# Patient Record
Sex: Female | Born: 1965 | Race: White | Hispanic: No | Smoking: Never smoker
Health system: Southern US, Community
[De-identification: ages and names within clinical notes are randomized; demographics above are authoritative.]

## PROBLEM LIST (undated history)

## (undated) DIAGNOSIS — E119 Type 2 diabetes mellitus without complications: Secondary | ICD-10-CM

## (undated) DIAGNOSIS — I10 Essential (primary) hypertension: Secondary | ICD-10-CM

## (undated) DIAGNOSIS — F319 Bipolar disorder, unspecified: Secondary | ICD-10-CM

---

## 1998-05-04 ENCOUNTER — Emergency Department (HOSPITAL_COMMUNITY): Admission: EM | Admit: 1998-05-04 | Discharge: 1998-05-04 | Payer: Self-pay | Admitting: Emergency Medicine

## 1998-05-13 ENCOUNTER — Ambulatory Visit (HOSPITAL_COMMUNITY): Admission: RE | Admit: 1998-05-13 | Discharge: 1998-05-14 | Payer: Self-pay | Admitting: Orthopaedic Surgery

## 1998-10-27 ENCOUNTER — Encounter: Admission: RE | Admit: 1998-10-27 | Discharge: 1999-01-25 | Payer: Self-pay | Admitting: Orthopaedic Surgery

## 1999-07-09 ENCOUNTER — Other Ambulatory Visit: Admission: RE | Admit: 1999-07-09 | Discharge: 1999-07-09 | Payer: Self-pay | Admitting: Obstetrics & Gynecology

## 2001-07-16 ENCOUNTER — Other Ambulatory Visit: Admission: RE | Admit: 2001-07-16 | Discharge: 2001-07-16 | Payer: Self-pay | Admitting: Obstetrics & Gynecology

## 2002-07-17 ENCOUNTER — Other Ambulatory Visit: Admission: RE | Admit: 2002-07-17 | Discharge: 2002-07-17 | Payer: Self-pay | Admitting: Obstetrics & Gynecology

## 2003-07-22 ENCOUNTER — Other Ambulatory Visit: Admission: RE | Admit: 2003-07-22 | Discharge: 2003-07-22 | Payer: Self-pay | Admitting: Obstetrics & Gynecology

## 2004-07-27 ENCOUNTER — Other Ambulatory Visit: Admission: RE | Admit: 2004-07-27 | Discharge: 2004-07-27 | Payer: Self-pay | Admitting: Obstetrics & Gynecology

## 2004-11-30 ENCOUNTER — Ambulatory Visit (HOSPITAL_BASED_OUTPATIENT_CLINIC_OR_DEPARTMENT_OTHER): Admission: RE | Admit: 2004-11-30 | Discharge: 2004-11-30 | Payer: Self-pay | Admitting: Otolaryngology

## 2004-11-30 ENCOUNTER — Encounter (INDEPENDENT_AMBULATORY_CARE_PROVIDER_SITE_OTHER): Payer: Self-pay | Admitting: *Deleted

## 2004-11-30 ENCOUNTER — Ambulatory Visit (HOSPITAL_COMMUNITY): Admission: RE | Admit: 2004-11-30 | Discharge: 2004-11-30 | Payer: Self-pay | Admitting: Otolaryngology

## 2005-07-01 ENCOUNTER — Ambulatory Visit (HOSPITAL_COMMUNITY): Admission: RE | Admit: 2005-07-01 | Discharge: 2005-07-01 | Payer: Self-pay | Admitting: Otolaryngology

## 2005-07-01 ENCOUNTER — Encounter (INDEPENDENT_AMBULATORY_CARE_PROVIDER_SITE_OTHER): Payer: Self-pay | Admitting: *Deleted

## 2005-07-01 ENCOUNTER — Ambulatory Visit (HOSPITAL_BASED_OUTPATIENT_CLINIC_OR_DEPARTMENT_OTHER): Admission: RE | Admit: 2005-07-01 | Discharge: 2005-07-01 | Payer: Self-pay | Admitting: Otolaryngology

## 2005-07-08 ENCOUNTER — Emergency Department (HOSPITAL_COMMUNITY): Admission: EM | Admit: 2005-07-08 | Discharge: 2005-07-08 | Payer: Self-pay | Admitting: Emergency Medicine

## 2005-08-09 ENCOUNTER — Other Ambulatory Visit: Admission: RE | Admit: 2005-08-09 | Discharge: 2005-08-09 | Payer: Self-pay | Admitting: Obstetrics & Gynecology

## 2006-05-24 ENCOUNTER — Emergency Department (HOSPITAL_COMMUNITY): Admission: EM | Admit: 2006-05-24 | Discharge: 2006-05-25 | Payer: Self-pay | Admitting: Emergency Medicine

## 2006-09-06 ENCOUNTER — Ambulatory Visit: Payer: Self-pay | Admitting: Internal Medicine

## 2006-09-07 ENCOUNTER — Ambulatory Visit: Payer: Self-pay | Admitting: Internal Medicine

## 2006-09-07 ENCOUNTER — Encounter (INDEPENDENT_AMBULATORY_CARE_PROVIDER_SITE_OTHER): Payer: Self-pay | Admitting: Specialist

## 2006-10-17 ENCOUNTER — Ambulatory Visit: Payer: Self-pay | Admitting: Internal Medicine

## 2006-10-19 ENCOUNTER — Ambulatory Visit (HOSPITAL_COMMUNITY): Admission: RE | Admit: 2006-10-19 | Discharge: 2006-10-19 | Payer: Self-pay | Admitting: Internal Medicine

## 2006-12-27 ENCOUNTER — Ambulatory Visit: Payer: Self-pay | Admitting: Internal Medicine

## 2006-12-27 LAB — CONVERTED CEMR LAB
BUN: 4 mg/dL — ABNORMAL LOW (ref 6–23)
Basophils Absolute: 0 10*3/uL (ref 0.0–0.1)
Basophils Relative: 0.3 % (ref 0.0–1.0)
CO2: 29 meq/L (ref 19–32)
Calcium: 9.2 mg/dL (ref 8.4–10.5)
Eosinophils Absolute: 0.4 10*3/uL (ref 0.0–0.6)
GFR calc Af Amer: 174 mL/min
GFR calc non Af Amer: 144 mL/min
Monocytes Relative: 4.1 % (ref 3.0–11.0)
Neutro Abs: 9.6 10*3/uL — ABNORMAL HIGH (ref 1.4–7.7)
Platelets: 388 10*3/uL (ref 150–400)
Sed Rate: 12 mm/hr (ref 0–25)
WBC: 14 10*3/uL — ABNORMAL HIGH (ref 4.5–10.5)

## 2007-01-03 ENCOUNTER — Ambulatory Visit: Payer: Self-pay | Admitting: Internal Medicine

## 2007-01-03 ENCOUNTER — Encounter (INDEPENDENT_AMBULATORY_CARE_PROVIDER_SITE_OTHER): Payer: Self-pay | Admitting: *Deleted

## 2007-01-03 DIAGNOSIS — K2940 Chronic atrophic gastritis without bleeding: Secondary | ICD-10-CM | POA: Insufficient documentation

## 2007-02-21 ENCOUNTER — Ambulatory Visit: Payer: Self-pay | Admitting: Internal Medicine

## 2007-05-15 ENCOUNTER — Ambulatory Visit: Payer: Self-pay | Admitting: Internal Medicine

## 2007-05-15 LAB — CONVERTED CEMR LAB
Basophils Relative: 0.2 % (ref 0.0–1.0)
Eosinophils Absolute: 0.3 10*3/uL (ref 0.0–0.6)
Eosinophils Relative: 2.5 % (ref 0.0–5.0)
HCT: 37.9 % (ref 36.0–46.0)
Lymphocytes Relative: 24.4 % (ref 12.0–46.0)
MCV: 89.4 fL (ref 78.0–100.0)
Neutrophils Relative %: 68.1 % (ref 43.0–77.0)
Platelets: 351 10*3/uL (ref 150–400)
RBC: 4.24 M/uL (ref 3.87–5.11)
WBC: 11.6 10*3/uL — ABNORMAL HIGH (ref 4.5–10.5)

## 2007-06-02 ENCOUNTER — Inpatient Hospital Stay (HOSPITAL_COMMUNITY): Admission: EM | Admit: 2007-06-02 | Discharge: 2007-06-05 | Payer: Self-pay | Admitting: Emergency Medicine

## 2007-06-19 ENCOUNTER — Emergency Department (HOSPITAL_COMMUNITY): Admission: EM | Admit: 2007-06-19 | Discharge: 2007-06-19 | Payer: Self-pay | Admitting: Emergency Medicine

## 2007-11-14 ENCOUNTER — Ambulatory Visit: Payer: Self-pay | Admitting: Internal Medicine

## 2007-11-19 ENCOUNTER — Ambulatory Visit: Payer: Self-pay | Admitting: Internal Medicine

## 2007-11-19 ENCOUNTER — Encounter: Payer: Self-pay | Admitting: Cardiothoracic Surgery

## 2008-02-15 DIAGNOSIS — Z8719 Personal history of other diseases of the digestive system: Secondary | ICD-10-CM

## 2010-11-30 NOTE — Procedures (Signed)
Summary: [facility]  [facility]   Imported By: June McMurray CMA 02/15/2008 13:20:57  _____________________________________________________________________  External Attachment:    Type:   Image     Comment:   External Document

## 2011-03-15 NOTE — Assessment & Plan Note (Signed)
[facility]                         GASTROENTEROLOGY OFFICE NOTE   CHELLE, CAYTON                         MRN:          [id number]  DATE:11/14/2007                            DOB:          Nov 24, 1965    CHIEF COMPLAINT:  Black stools, question bleeding.   HISTORY:  This is a 45 year old white woman who said that several  episodes of diarrhea and black, tarry stools have occurred.  She has  bilateral left and right lower quadrant abdominal pain.  Frequently she  feels like I need to defecate and then often just has gas.  Recently  saw her gynecologist (Dr. Jennette Kettle), says her finger prick was okay so it  sounds like her hemoglobin was alright.  She has not been using any  NSAIDs.   PAST MEDICAL HISTORY:  1. Symptoms consistent with gastroesophageal reflux disease.  2. Irritable bowel syndrome.  3. EGD January 03, 2007 - Mild erythema, possible inflammatory changes in      antrum, biopsies showed severe chronic gastritis, benign small      intestine.  There was no H. pylori.  Colonoscopy September 07, 2006 -      Normal into the terminal ileum, random biopsies normal, no      microscopic colitis.  4. Anxiety.  5. Depression.  6. Clinical diagnosis of C. diff colitis in the past by Dr. Kinnie Scales, no      toxin positive.  7. Allergies.  8. Prior tubal ligation.  9. Previous back surgery.  10.SULFA ALLERGY CAUSING ABDOMINAL PAIN, NAUSEA AND VOMITING.   MEDICATIONS:  Listed and reviewed in the chart.  She is on Xanax,  Prozac, Yaz, trazodone, intermittent magnesium citrate, Dulcolax and  Phenergan.   ADDITIONAL MEDICAL HISTORY:  She had a left lower extremity fracture in  the Summer and a plate and screws which are apparently working their way  out and causing problems and she is going to need surgery.  She also had  a right lower extremity sprain.   SOCIAL HISTORY:  She was to have a new job at [employer] and she started  that but has had to put that on  hold it sounds like because of the  medical issues above.  Dr. Thomasena Edis is her orthopedic surgeon.   PHYSICAL EXAMINATION:  Weight 204 pounds, pulse 76, blood pressure  98/62.  ABDOMEN:  Mildly tender in the epigastrium without organomegaly or mass.  RECTAL:  Exam in the presence of female nursing staff shows Rickey stool  that is Hemoccult positive.  There is no mass.  The perianal area is  without changes.   ASSESSMENT:  1. Heme positive stool, history compatible with melena.  2. Abdominal pain.  3. Gastroesophageal reflux disease.   1. Schedule capsule endoscopy.  This lady has a heme positive stool,      she has had possible melena and previous EGD and colonoscopy were      unrevealing.  She does not seem to be on any NSAIDs that would      cause this.  I wonder if she could have Crohn's disease  given the      overall symptom and sign complex.  2. Gastroesophageal reflux disease, Zegerid has worked well, she is      off of that right now.  We will prescribe it and give samples.  3. Further plans pending clinical course.  Will contact her after the      capsule endoscopy is finished.     Iva Boop, MD,FACG  Electronically Signed    CEG/MedQ  DD: 11/17/2007  DT: 11/17/2007  Job #: [id number]   cc:   Nita Sells, M.D.

## 2011-03-15 NOTE — Op Note (Signed)
[name], [name]                ACCOUNT NO.:  [id number]   MEDICAL RECORD NO.:  [id number]          PATIENT TYPE:  INP   LOCATION:  1611                         FACILITY:  [facility]   PHYSICIAN:  Erasmo Leventhal, M.D.DATE OF BIRTH:  09-27-1966   DATE OF PROCEDURE:  06/02/2007  DATE OF DISCHARGE:                               OPERATIVE REPORT   Prior to surgical intervention I reviewed with the patient risks and  benefits of surgical intervention in detail for a left ankle  trimalleolar fracture.  She understood and wished to proceed.   PREOPERATIVE DIAGNOSIS:  Left ankle trimalleolar fracture.   POSTOPERATIVE DIAGNOSIS:  Left ankle trimalleolar fracture.   PROCEDURE:  1. Left ankle open reduction internal fixation.  2. C-arm stress radiography.   SURGEON:  Eugenia Mcalpine, M.D.   ASSISTANT:  Jodene Nam, P.A.C.   ANESTHESIA:  General, Dr. Shireen Quan.   ESTIMATED BLOOD LOSS:  Less than 25 mL.   COMPLICATIONS:  None.   DISPOSITION:  To PACU stable.   OPERATIVE DETAILS:  The patient was admitted through the emergency room.  Counseled and discussed.  She went to the operating room where IV  antibiotics were given.  Placed under general anesthesia.  __________  well padded and bumped.  The left lower extremity was elevated, prepped  with DuraPrep, draped in a sterile fashion.  Using an Esmarch, the  tourniquet was inflated to 300 mmHg.  A lateral incision was made in the  skin and subcutaneous tissue, small cutaneous nerves were protected.  The distal fibula fracture was identified, it was opened, periosteum was  removed, it was irrigated, __________ was removed, it was reduced  anatomically.  A one-third tubular plate was applied to the posterior  aspect utilizing the standard AO technique __________ the posterior  aspect of the fibula with an interfrag screw also.  At this time we had  a well anatomically aligned stable distal fibular fracture.  Attention  directed to  the medial side where an incision was made through the skin  and subcutaneous tissue, the saphenous vein or nerve retracted out of  the way.  The periosteum was opened, joint was irrigated, fracture  reduced anatomically, held in place with an AO 4.0 cannulated screw pin  guidewire.  Stress radiography and x-ray revealed anatomic reduction,  excellent placement of wire and then following this an appropriate #4-0  cannulated screw was placed with a soft tissue washer.  We now checked  with the C-arm, we had an excellent reduction of the fractures in all  planes and __________ .  Wounds were copiously irrigated.  On the medial  side subcu closed with Vicryl, skin closed with nylon, lateral side  periosteum and fascia with Vicryl, subcu Vicryl.  Skin closed with  nylon.  For the anesthesia, 20 mL of 0.25% Marcaine was placed near the  skin edges for postoperative pain control, a sterile dressing applied,  tourniquet was deflated, normal circulation for the remainder of the  case.  She tolerated the procedure well, there were no complications or  problems.  She was gently wakened and taken  from the operating room to  the PACU in stable condition.  Again, she was placed in plaster splints.   SURGICAL TECHNIQUE AND DECISION-MAKING:  Mr. Jodene Nam, P.A.C.'s  assistance was needed throughout the entire case.           ______________________________  Erasmo Leventhal, M.D.     RAC/MEDQ  D:  06/02/2007  T:  06/03/2007  Job:  [id number]

## 2011-03-15 NOTE — Discharge Summary (Signed)
[name], [name]                ACCOUNT NO.:  [id number]   MEDICAL RECORD NO.:  [id number]          PATIENT TYPE:  INP   LOCATION:  1611                         [facility]ACILITY:  [facility]   PHYSICIAN:  Erasmo Leventhal, M.D.DATE OF BIRTH:  06/22/1966   DATE OF ADMISSION:  06/02/2007  DATE OF DISCHARGE:  06/05/2007                               DISCHARGE SUMMARY   ADMITTING DIAGNOSIS:  Trimalleolar ankle fracture left ankle and grade 2  ankle sprain right ankle.   DISCHARGE DIAGNOSIS:  Trimalleolar ankle fracture left ankle and grade 2  ankle sprain right ankle.   OPERATION:  ORIF trimalleolar fracture left ankle.   BRIEF HISTORY:  This is a 45 year old lady with a history of a fall on  the evening of admission with a trimalleolar fracture.  She subsequently  was taken to the operating room for ORIF of her fracture and admitted  for pain control and physical therapy.  The surgery's benefits and  aftercare were discussed in detail with the patient.  Questions invited  and answered.   LABORATORY VALUES:  Admission CBC shows a white count high at 12.7,  neutrophils high at 9.4.  Repeat CBC showed white count normal.  Hemoglobin 11.7, hematocrit 33.6.  Admission PT and PTT were within  normal limits.  An admission BMET within normal limits with the  exception of a low BUN at 5.   COURSE IN THE HOSPITAL:  The patient tolerated the operative procedure  well.  Postoperatively the patient did have some problems with pain  control.  Did state that she did not have very good pain tolerance.  First postoperative day vital signs were stable.  Neurovascular she was  intact.  She was complaining of throbbing pain __________  .  She was  put on a PCA for pain control.  Second postoperative day she was in  moderate pain.  Had some nausea.  Vital signs stable, afebrile, O2 95 on  2 liters.  Lungs clear.  Heart sounds normal.  Bowel sounds very  sluggish.  Calves negative.  Neurovascular status  intact in the toes.  We discussed her pain issues and the fact that her nausea was coming  from decreased bowel motility secondary to the narcotics.  Her PCA was  stopped.  She was switched over to p.o. pain medicine and did better.  On the third postoperative day vital signs stable, afebrile.  Neurovascular status intact.  Her foot swelling was down and she was  doing much better, and subsequently is to be discharged today after  physical therapy.  Will arrange for home physical therapy.   CONDITION ON DISCHARGE:  Improved.   DISCHARGE MEDICATIONS:  1. Percocet 5/325 1-2 q.4-6 h. p.r.n. pain.  2. Robaxin 500 one p.o. q.8 h. p.r.n. spasm.  3. Keflex 250 mg one p.o. q.i.d.  4. Aspirin 81 mg one daily for three weeks.   DISCHARGE INSTRUCTIONS:  Nonweightbearing on the left leg, elevate and  ice the right ankle, elevate the left ankle.  Call the office today for  recheck in 10 days or call sooner p.r.n. problems.  Jaquelyn Bitter. Chabon, P.A.    ______________________________  Erasmo Leventhal, M.D.    SJC/MEDQ  D:  06/05/2007  T:  06/05/2007  Job:  [id number]

## 2011-03-15 NOTE — H&P (Signed)
[name]                ACCOUNT NO.:  [id number]   MEDICAL RECORD NO.:  [id number]          PATIENT TYPE:  INP   LOCATION:  1611                         FACILITY:  [facility]   PHYSICIAN:  Erasmo Leventhal, M.D.DATE OF BIRTH:  09/14/1966   DATE OF ADMISSION:  06/02/2007  DATE OF DISCHARGE:                              HISTORY & PHYSICAL   CHIEF COMPLAINT:  Right ankle sprain, left ankle bimalleolar fracture.   HISTORY OF PRESENT ILLNESS:  This is a 45 year old lady with a history  of fall earlier today with pain in both ankles.  She was brought to  emergency room, where she was evaluated and noted to have a bi to  trimalleolar fracture left ankle and a sprain of her right ankle.  We  were called, as we are on call for the ER and saw her.  At this time,  she has a grade 2 ankle sprain of her right ankle, mainly lateral and a  significant bimalleolar fracture, questionable small trimalleolar with  posterior fragment of the left ankle that will require surgery.  The  surgery risks, benefits and aftercare were discussed in detail with the  patient, questions invited and answered, and surgery to go ahead as  scheduled.   PAST MEDICAL HISTORY:  DRUG ALLERGIES TO SULFA WITH NAUSEA AND VOMITING.   CURRENT MEDICATIONS:  1. Wellbutrin 75 mg one p.o. t.i.d.  2. Prozac 40 mg one daily.  3. Xanax 0.5 mg t.i.d.  4. Yaz one daily.  5. Zegerid p.r.n.   SERIOUS MEDICAL ILLNESS:  Include depression, PMS and reflux.   PREVIOUS SURGERIES:  Include lumbar surgery, sinus surgery and tubal  ligation.   FAMILY HISTORY:  Negative.   SOCIAL HISTORY:  The patient works in [employer].  She  is married.  She does not smoke and drinks rarely.   REVIEW OF SYSTEMS:  CNS:  Negative for headache, blurred vision or  dizziness.  PULMONARY:  Negative for shortness breath, PND or orthopnea.  CARDIOVASCULAR:  Negative for chest pain or palpitation.  GI:  Positive  for severe GERD  with no history of ulcers.  GU: Negative for urinary  tract difficulty.  MUSCULOSKELETAL:  Positive in HPI.   PHYSICAL EXAM:  VITAL SIGNS:  BP 130/84, respiration 16, pulse 80 and  regular.  SKIN:  Pulse 80 and regular.  GENERAL APPEARANCE:  This well-nourished lady in moderate distress with  both ankles.  HEENT:  Head normocephalic and atraumatic.  Nose patent.  Ears patent.  Pupils equal, round, react to light.  Throat without injection.  NECK:  Supple without adenopathy.  Carotids 2+ without bruit.  CHEST:  Clear to auscultation.  No rales or rhonchi.  Respirations 16.  HEART:  Regular rate and rhythm at 80 beats per minute without murmur.  ABDOMEN:  Soft with active bowel sounds.  No mass or organomegaly,  atraumatic.  No pain to AP or lateral pelvic compression.  NEUROLOGIC:  Patient alert and oriented to time, place and person.  Cranial nerves II-  XII grossly intact.  EXTREMITIES:  Shows the right ankle  with swelling over the lateral  aspect, tender over the talofibular with a negative drawer test, but  pain, minimal pain over the deltoid ligament.  X-rays revealed no bony  abnormalities.  As far as the left ankle is concerned, she has a  displaced medial and lateral malleolar fracture with a very small  posterior malleolar fracture.   ASSESSMENT:  1. Trimalleolar fracture, left ankle with displaced bimalleolar      fracture.  2. Grade 2 sprain, lateral right ankle.   PLAN:  ORIF left ankle and cam walker right ankle.      Jaquelyn Bitter. Chabon, P.A.    ______________________________  Erasmo Leventhal, M.D.    SJC/MEDQ  D:  06/02/2007  T:  06/03/2007  Job:  [id number]

## 2011-03-15 NOTE — Assessment & Plan Note (Signed)
 [facility]                         GASTROENTEROLOGY OFFICE NOTE   MIYAKO, OELKE                         MRN:          [id number]  DATE:05/15/2007                            DOB:          August 05, 1966    CHIEF COMPLAINT:  Bloating, distention, constipation, abdominal pain,  nausea, vomiting.   Ms. Henthorn had done well since April saying the Zegerid had helped.  She  says she is taking it everyday though I'd only given her samples.  I  think she is using it intermittently.  She is still using some Librax.  At any rate, last Thursday she seemed to stop moving her bowels and  noted progressive distention.  She has had a lot of nausea and one  episode of vomiting.  She had one episode of rectal bleeding several  weeks ago with a large bowel movement.  She has tried citrate of  magnesia and said she did not move her bowels.  She has tried Dulcolax  with minimal success and an enema yesterday produced a small bowel  movement.  She has a constant sensation that she needs to defecate.  She  has a pressure in her back.  There were no urinary symptoms.  I had her  do a CBC, that was normal.  Previously seen mild eosinophilia is absent.  Two view abdominal film normal.   MEDICATIONS:  Listed and reviewed in the chart.   PHYSICAL EXAMINATION:  Weight 194 pounds, pulse 80, blood pressure  106/72.  BACK:  No CVA tenderness.  ABDOMEN:  Soft, minimally tender, bowel sounds are present though  slightly decreased.  No tympanitic changes.  She has some lower quadrant  tenderness to deep palpation.   ASSESSMENT:  Flare of irritable bowel syndrome.  I did do a rectal exam  with female nursing staff present, there is no stool in the vault.   PLAN:  Drink 2-4 liter of TriLyte to flush.  We will see what that does.  Continue antispasmodics with Librax.  She will call to let us know the  response to this therapeutic intervention.     Iva Boop, MD,FACG  Electronically Signed    CEG/MedQ  DD: 05/15/2007  DT: 05/16/2007  Job #: [id number]   cc:   Aida Puffer

## 2011-03-18 NOTE — Consult Note (Signed)
[name], [name]                ACCOUNT NO.:  [id number]   MEDICAL RECORD NO.:  [id number]          PATIENT TYPE:  EMS   LOCATION:  ED                           FACILITY:  [facility]   PHYSICIAN:  Anselm Pancoast. Weatherly, M.D.DATE OF BIRTH:  12-19-1965   DATE OF CONSULTATION:  05/24/2006  DATE OF DISCHARGE:  05/25/2006                                   CONSULTATION   CHIEF COMPLAINT:  Nausea and vomiting, diarrhea for four days duration.   HISTORY:  [name] is a 45 year old Caucasian female referred in today  by Dr. Clarene Duke in [place], [place] who saw her for symptoms which she  describes as follow:  She said on Sunday she had the onset of diarrhea and  nausea and vomiting.  She had eaten some food earlier.  No one else that she  is aware of got ill and she thought she had food poisoning.  The following  day she said she must have had 30 diarrhea stools and vomited numerous  times.  May be a little better the following day and then today was having  pain again and saw Dr. Clarene Duke.  He examined her and was impressed that she  was mildly distended and a tender abdomen and called me.  Wanted to send her  to the emergency room for surgical consultation.  He gave her Demerol 50 mg  I think IV and Phenergan IV or IM maybe.  When she arrived here, she  certainly did not have an acute abdomen but she was vaguely tender and she  localizes her pain more on the right side than the left.  She is on medicine  to prevent her from having regular menstrual periods and has had a previous  tubal ligation.  Her past history is significant in that approximately a  year ago she was having problems with sinusitis and had two sinus surgeries.  While on antibiotics, had the onset of significant diarrhea. Was diagnosed  with Clostridium difficile colitis by her gynecologist, Dr. Konrad Dolores, and  treated with Flagyl.  Since then she has had no definite bad episodes of  diarrhea, but does have diarrhea a  little bit more frequent than she would  consider normal.  When she was seen by Dr. Clarene Duke, she did not have a fever  and her pulse was described as 79.  He thought that her stools were guaiac  positive since she said she had noticed a little bit of blood in her stools  on Monday when she was having so many bowel movements, but on my examination  here, I did not find any blood in her stools.  The stool was actually kind  of soft and Lalanne in color.  She has had no abdominal surgery with the  exception of the tubal ligation laparoscopically.  We got an acute abdominal  series and it showed nonspecific gas pattern.  Chest was unremarkable.  I  then had laboratory studies and this showed a white count of about 13,000  with not a significant left shift.  The patient was then given an  oral  contrast for CT scan of the abdomen and pelvis and this was read by Dr. Huston Foley and we see that she has a fibroid.  We see maybe a little bit of  fluid in the right pelvis, but not any obvious pelvic pathology.  There is  no evidence of any diverticulitis or thickened colon, like a Clostridium  difficile colitis.  Her gallbladder does not show any stones and the small  bowel gas pattern is essentially unremarkable.  There is a little fusion on  the right lung that was not noted on the plain abdominal films.   The patient has now been here in the ER about four or five hours. Has  received a liter of lactated Ringer's and says she is definitely feeling  better.  I did give her 12.5 mg of Phenergan but she has had no additional  pain medicine.  She really does not have an acute surgical abdomen at this  time and has had two loose bowel movements since she has been here.  I am  going to give her another liter of IV fluid since I think with all the  diarrhea and all she has had, she was kind of mildly volume restricted even  though her BUN was 5, glucose 80 and her electrolytes were normal.  Potassium was  3.4.  Liver function studies were normal.   IMPRESSION:  I expect that this was a viral gastroenteritis.  I will get a  stool sample and send it for Clostridium difficile since she has had  Clostridium difficile one year ago but she has been on no antibiotics  recently that would usually precede the onset of Clostridium difficile  colitis.  I am going to give her a few Phenergan tablets and suggest that  she be on a liquid diet for 24 hours.  She may ultimately need a colon exam  if she has more frequent episodes of  diarrhea even though there was certainly nothing on the CT scan that  suggests Clostridium difficile colitis, diverticulitis or any type  inflammatory bowel disease.  I was not able to confirm any blood in her  stool on the stool sample that I obtained on rectal examination.  She will  be released after completing the second liter of IV fluids.           ______________________________  Anselm Pancoast. Zachery Dakins, M.D.     WJW/MEDQ  D:  05/24/2006  T:  05/25/2006  Job:  [id number]

## 2011-03-18 NOTE — Assessment & Plan Note (Signed)
[facility]                         GASTROENTEROLOGY OFFICE NOTE   PANDA, CROSSIN                         MRN:          [id number]  DATE:12/27/2006                            DOB:          1966-07-15    CHIEF COMPLAINT:  Persistent nausea, vomiting and abdominal pain, some  diarrhea.   Ms. Klett returns. I had seen her because of these problems before. A  colonoscopy with terminal ileum intubation and random biopsies was  unrevealing. She was started on dicyclomine and Zegerid and that seemed  to help. She came back with then some mild complaints of dysphagia,  nausea, vomiting and persistent diarrhea and mild eosinophilia.  Abdominal ultrasound was unrevealing. Upper gastrointestinal series  demonstrated some reflux, but otherwise unremarkable. She seems to think  Zegerid helped a little bit, but she went on to run out of that and  could not afford it. Dicyclomine is probably helping some. She still has  at least one time a week spells where she develops pressure and  discomfort in the peri-umbilical area with what feels like an urge to  defecate. Then, she has radiation of these symptoms upwards with nausea  and eventually vomits several hours after eating. Recently, she had a  spell where she vomited some flecks of blood in mucus and saliva type  material. She is quite frustrated over this and has also tried [employer] acid reducer and that has not helped. She remains on Xanax and  Prozac. She says she is not overly anxious about things other than  feeling poorly and not being able to work. She questions if C-diff could  cause this as she did have that in the past and was treated with  vancomycin and improved. All of her stool studies from Dr. Eliberto Ivory were  negative back in the early winter. The diarrhea is really significantly  better. These symptoms are episodic.   Her weight is climbing which is quite puzzling to her since she has  been  ill.   PHYSICAL EXAMINATION:  Weight 190 pounds. Blood pressure 118/86.  LUNGS:  Clear.  BACK: Is without CVA tenderness.  ABDOMEN: Shows she is tender to the right of the umbilicus without  organomegaly or mass or rebound. Bowel sounds are present.  She is alert and oriented x3. She is somewhat anxious.   ASSESSMENT:  Recurrent nausea and vomiting, abdominal pain, minor  hematemesis. The pain is peri-umbilical. She had diarrhea symptoms,  which are improved. She is gaining weight. There is a history of a tubal  ligation which raises some question of adhesions. Though she does have  some reflux symptomatology and she had this hematemesis, the pain  starting in the peri-umbilical area is suggestive of perhaps some sort  of obstructive process more so. The eosinophilia has raised the question  of some sort of allergic or parasitic phenomena, though I would expect  her to lose weight if she had parasites.   I have ordered a CBC, sed rate, and BMET today. Her sed rate is normal  at 14. Her white blood cell count is high  at 14,000, but she does not  have eosinophilia at this time. Her basic metabolic panel was normal  overall. Her glucose was 1 point elevated. Her BUN is low at 4.   The etiology of this is not really entirely clear. The objective data we  have is reassuring, but she is quite symptomatic.   I think we are going to need to do an EGD and probably a CT enterography  procedure. Small bowel biopsies could be necessary as well. Further  plans pending that. We will contact her.   Note, I did explain to her the possibility of a post-infectious  irritable bowel syndrome. She does not have C-diff at this time based  upon her colonoscopy and previous stool studies.     Iva Boop, MD,FACG  Electronically Signed    CEG/MedQ  DD: 12/27/2006  DT: 12/27/2006  Job #: [id number]   cc:   Weyman Pedro

## 2011-03-18 NOTE — Assessment & Plan Note (Signed)
[facility]                         GASTROENTEROLOGY OFFICE NOTE   [name], [name]                         MRN:          [id number]  DATE:11/19/2007                            DOB:          1966/03/02    PROCEDURE:  Capsule endoscopy of the small bowel.   DATE:  November 19, 2007   INDICATIONS:  This is a 45 year old woman with heme positive stool,  history of possible melena.  Previous EGD and colonoscopy unrevealing.  There is some question of possible inflammatory bowel disease though it  is thought she had irritable bowel syndrome.   FINDINGS:  Completed study with a normal villous pattern, minimal  duodenitis.  No explanation of signs or symptoms seen.   PLAN:  Will treat for irritable bowel syndrome as we are.  Further plans  pending clinical course, anemia and heme positive stool or the possible  melena is described.     Iva Boop, MD,FACG  Electronically Signed    CEG/MedQ  DD: 11/29/2007  DT: 11/30/2007  Job #: [id number]

## 2011-03-18 NOTE — Assessment & Plan Note (Signed)
[facility]                         GASTROENTEROLOGY OFFICE NOTE   MEGANNE, RITA                         MRN:          [id number]  DATE:02/21/2007                            DOB:          03/06/1966    CHIEF COMPLAINT:  Follow-up of nausea and vomiting.   PROBLEMS:  1. Recurrent nausea and vomiting problems of unclear etiology.  EGD,      colonoscopy (she has had diarrhea and rectal bleeding) with      terminal ileum intubation and random biopsies unrevealing.  Upper      GI shows reflux.  Abdominal ultrasound unrevealing.  She has been      unresponsive to Zegerid and dicyclomine, though Zegerid probably      helped the heartburn.  2. Depression.  3. History of dysphagia.  4. Mild eosinophilia with negative ova and parasite exam.  5. Clinical diagnosis of C. difficile colitis in the past (Dr.      Kinnie Scales), I do not think she had a C. difficile toxin positive.  6. Anxiety.  7. Allergies.  8. Prior tubal ligation.  9. Previous back surgery.  10.SULFA allergy causing abdominal pain, nausea and vomiting.   MEDICATIONS:  1. Xanax 0.5 mg b.i.d.  2. Prozac 40 mg daily.  3. Generic [employer] acid reducer daily.  4. Librax one before meals.  5. Wellbutrin 75 mg t.i.d.  6. Phenergan 25 mg p.r.n.   Her biggest problem now is early morning nausea and vomiting.  She does  not have the abdominal pain problems that she had previously.  She is  not having diarrhea or bleeding.  She thinks the Librax may have helped  her.  Her weight has not dropped.  She still has problems every morning  as described.   VITAL SIGNS:  Weight 191 pounds, pulse 104, blood pressure 114/68.   ASSESSMENT:  Recurrent and persistent vomiting with nausea, reflux on  upper GI.  When I first saw her, I wondered about the possibility of  some sort of obstructive phenomena from adhesions, but she clearly is  better.  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  I think her early morning nausea problems  could be related to nocturnal reflux.   PLAN:  Trial of Zegerid samples 40 mg at night.  Will give her about 2  months' worth.  She will take that at bedtime.  If this is not working  within a month, I want her to call me back and we will consider other  testing, which could include CT angiography though I think the yield  would not be that high.  Keep in mind, she had transient eosinophilia  though I can find no evidence of parasites or autoimmune inflammation  problems.  She has had a mild elevation of her white blood cell count  but a normal sedimentation rate, and further lab testing could be in  order.  She is in agreement with the plan.     Iva Boop, MD,FACG  Electronically Signed    CEG/MedQ  DD: 02/21/2007  DT: 02/21/2007  Job #: [id number]   cc:   Weyman Pedro

## 2011-03-18 NOTE — Assessment & Plan Note (Signed)
[facility]                         GASTROENTEROLOGY OFFICE NOTE   [name], [name]                         MRN:          [id number]  DATE:10/17/2006                            DOB:          1965/11/25    CHIEF COMPLAINT:  Persistent nausea, vomiting, diarrhea.   The lab data did not help at all.  She still has had 4 spells of  vomiting.  She is now describing intermittent solid food dysphagia as  well, without heartburn.  At least once a week, she has severe cramps  and then diarrhea for 45 minutes or so.  She is not describing bleeding.  She is not disturbed at sleep.  She had to stop her Wellbutrin because  she could not afford it.  Her colonoscopy and terminal ileal intubation  and random colon biopsies were normal.  On previous labs, she had a mild  eosinophilia.   PHYSICAL:  Weight 180 pounds.  Pulse 96.  Blood pressure 148/74.  ABDOMEN:  Shows some right mid and right upper quadrant tenderness.  There is no guarding.  The pain disappears with muscle contraction.   ASSESSMENT:  1. Dysphagia.  2. Persistent nausea and vomiting.  3. Persistent diarrhea.  4. Mild eosinophilia.  Note that previous ova and parasites study and      stool cultures were all negative.   PLAN:  1. Check abdominal ultrasound.  2. Check upper gastrointestinal series regarding dysphagia and nausea      and vomiting, right upper quadrant pain as well.  3. Dicyclomine 20 mg before meals.  4. Zegerid samples are give for 1 month.  5. We will contact with results of the studies and further plans.   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  She might  end up needing an endoscopy and dilatation but we are going to try this  approach first.  If the eosinophilia persists, I may need to look  further for parasites.  I have also checked the sedimentation rate  today.  With parasites, I would have expected more of a chronic daily  diarrhea rather than  episodic.  Capsule endoscopy could be indicated as  well.     Iva Boop, MD,FACG  Electronically Signed   CEG/MedQ  DD: 10/17/2006  DT: 10/17/2006  Job #: [id number]   cc:   Aida Puffer

## 2011-03-18 NOTE — Op Note (Signed)
[name], [name]                ACCOUNT NO.:  [id number]   MEDICAL RECORD NO.:  [id number]          PATIENT TYPE:  AMB   LOCATION:  DSC                          FACILITY:  [facility]   PHYSICIAN:  Kristine Garbe. Ezzard Standing, M.D.DATE OF BIRTH:  03-16-1966   DATE OF PROCEDURE:  07/01/2005  DATE OF DISCHARGE:                                 OPERATIVE REPORT   PREOPERATIVE DIAGNOSIS:  Chronic rhinitis with right ethmoid and right  maxillary sinusitis.   POSTOPERATIVE DIAGNOSIS:  Chronic rhinitis with right ethmoid and right  maxillary sinusitis.   OPERATION:  Functional endoscopic sinus surgery with right maxillary ostial  enlargement, with irrigation of right maxillary sinus, right anterior  ethmoidectomy with irrigation of ethmoid region on the right.   SURGEON:  Kristine Garbe. Ezzard Standing, M.D.   ANESTHESIA:  General endotracheal.   COMPLICATIONS:  None.   BRIEF CLINICAL NOTE:  [name] is a 45 year old female who had had  previous septal sinus surgery in January of 2006.  Over the last couple of  months, she has been having recurrent sinus infections again, had been on  several rounds of antibiotics.  She has continued to have trouble breathing  as well as pain in her nose and on followup in my office on August 1 showed  diffuse scabbing, crusting along the septum, turbinates, nasal membranes  with swollen passages, a left side more obstructed than the right.  She had  a CT scan done which showed opacification of the right maxillary sinus and  right anterior ethmoid region.  The left ethmoid and left maxillary sinuses  were relatively clear.  She had a large amount of crusting and swelling and  opacification of the nasal passages.  She has been on multiple rounds of  saline irrigations as well as antibiotics and is not doing any better.  I am  unable to clean the nose in the office because of the amount of pain that  she is having.  She was taken to the operating room this time for  irrigation, cultures and cleaning of the nasal sinus passages.   DESCRIPTION OF PROCEDURE:  After adequate endotracheal anesthesia, the nose  was prepped with Betadine solution and draped off with sterile towels.  Next, the nose was further prepped with cotton pledgets soaked in Afrin for  decongestant.  The patient had a large amount of crusting anteriorly  bilaterally in the nose as well as a large amount of crusting especially on  the right side of the septum anteriorly.  It looked like she was developing  a septal perforation.  After getting past the crusting on the left side, the  left middle turbinate appeared clear and the left middle meatus appeared  clear.  She still had a slight deviation of the septum to the left with a  spur posteriorly on the left side.   On the right side, she had a large amount of crusting involving just about  the entire inferior turbinate as well as the middle turbinate.  She had a  large amount of crusting and swelling of the membranes around the  right  maxillary ostia.  Using a curved suction, the ostia was still patent and  this was enlarged slightly with straight through-cut forceps and back-  cutting forceps.  The 30 degree scope was then used to visualize the right  maxillary sinus.  The membranes within the maxillary sinus were very  edematous and polypoid but not no gross mucopurulent discharge.  The ethmoid  area again was very edematous, bled easily.  All of the nasal membranes were  very edematous, almost granulomatous in appearance.  Several cultures were  obtained as was tissue sent.  Cultures were sent for aerobe, anaerobe and  fungus.  After washing and cleaning the ethmoid area in the maxillary on the  right side, 90 to 100 cc of saline was used to irrigate the right maxillary  sinus and ethmoid and right nasal cavity.   Because of the slight deviation of the septum to the left, using the long  speculum, the septum was pushed back toward  midline and a single pack soaked  in bacitracin ointment was placed in the left nasal cavity to help hopefully  straighten up and support the septum.  I was hesitant about doing any kind  of surgery on the septum because of the diffuse infection and appearance of  the development of a possible septal perforation.  After irrigating the  right nasal passage away with saline, bacitracin ointment was spread  throughout the right nasal cavity.  There was minimal bleeding after  application of Afrin cottonoids.  No packing was placed on the right side.  [name] was awoken from anesthesia and transferred to the recovery room and  postop doing well.   Of note, the nose was injected with 2 cc of Marcaine after completion to  help with pain control.   DISPOSITION:  The patient is discharged home later this morning on Augmentin  875 b.i.d. for ten days and Tylenol and Vicodin p.r.n. pain pending results  of cultures.  Antibiotics may be changed to doxycycline.  The patient will  follow up in my office in three days for a recheck to have the packing  removed from the left nasal cavity.           ______________________________  Kristine Garbe Ezzard Standing, M.D.     CEN/MEDQ  D:  07/01/2005  T:  07/01/2005  Job:  [id number]

## 2011-03-18 NOTE — Op Note (Signed)
[name], [name]                ACCOUNT NO.:  [id number]   MEDICAL RECORD NO.:  [id number]          PATIENT TYPE:  AMB   LOCATION:  DSC                          FACILITY:  [facility]   PHYSICIAN:  Kristine Garbe. Ezzard Standing, M.D.DATE OF BIRTH:  [birth year]   DATE OF PROCEDURE:  11/30/2004  DATE OF DISCHARGE:                                 OPERATIVE REPORT   PREOPERATIVE DIAGNOSES:  1.  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  2.  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  3.  Right ethmoid sinus disease.  4.  Right maxillary sinus disease.   POSTOPERATIVE DIAGNOSES:  1.  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  2.  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  3.  Right ethmoid sinus disease.  4.  Right maxillary sinus disease.   OPERATION:  1.  Septoplasty with bilateral inferior turbinate reductions.  2.  Reduction of right middle turbinate.  3.  Endoscopic sinus surgery with right ethmoidectomy and right maxillary      osteal enlargement.   SURGEON:  Kristine Garbe. Ezzard Standing, M.D.   ANESTHESIA:  General anesthesia.   COMPLICATIONS:  None.   BRIEF CLINICAL NOTE:  [name] is a 45 year old female who has had  problems with chronic pain in the right side of the cheek and right side of  the nose for several months now.  She has been on several rounds of  antibiotics.  She had a CT scan which showed a significant deviation of the  septum to the left and she complains of left-sided nasal obstruction but has  more pain and discomfort on the right side.  On review of the CT scan, she  has a very edematous right middle turbinate with obstruction about the right  [facility] with mucoperiosteal thickening within the right ethmoid and right  maxillary sinuses.  She is taken to the operating room at this time for a  septoplasty, turbinate  reductions and right ESS.   DESCRIPTION OF PROCEDURE:  After adequate endotracheal anesthesia, the  patient received 1 g of Ancef IV preoperatively.  The nose was prepped with  cotton pledgets soaked in a decongestant and the septum, turbinates and  middle meatus were injected with Xylocaine with epinephrine.  Of note, the  patient had raw mucosal membranes, especially on the right side of her nose,  on the right septum as well as the right inferior turbinate and right middle  turbinate.  The right middle turbinate was very edematous and thickened.  Because of the size of the right middle turbinate, the lower one-half of the  middle turbinate was amputated with straight Thru-Cut forceps.  This was  sent as a specimen.  Next, the septoplasty was performed.  A hemitransfixion  incision was made along the septum on the left side.  Mucoperichondrial and  mucoperiosteal flaps were elevated posteriorly.  At the junction of the bony  cartilaginous septum the septum bowed away  to the left side.  The bony  septum which protruded into the left airway was removed after elevating  mucoperiosteal flaps on either side of the bony septum.  This allowed the  septum to return more to the midline.  Next, inferior turbinate reductions  were performed by amputating the lower one-half of the inferior turbinate,  suction cautery was used for hemostasis and the remaining turbinate tissue  was out-fractured.  Then using the endoscope, the right side was approached.  The uncinate process was incised and removed.  The anterior ethmoid area was  opened up, there was minimal mucosal thickening, no polypoid disease and no  mucoid fluid noted.  The maxillary ostium was identified, it was enlarged  with back-biting and straight Thru-Cut forceps.  Again, the maxillary sinus  on the right side had a little bit thickened mucoperiosteum but no  significant purulence within the maxillary sinus.  This completed the   endoscopic procedure.  Suction cautery was used for hemostasis along the  posterior portion of the right middle turbinate which was amputated as well  as the right inferior turbinate.  The specimen from the right side was sent  to pathology.  A Kennedy sinus pack was placed within the right ethmoid  region.  The hemitransfixion incision was closed with interrupted 4-0  chromic suture.  Silastic was placed on either side of the septum and  secured with a 3-0 nylon suture.  The inferior turbinates were then injected  with 40 mg of Depo-Medrol each because of the chronic inflammation.  The  nose was packed with Telfa soaked in Bacitracin ointment.  Maryon was awoken  from anesthesia and transferred to recovery doing well.   DISPOSITION:  Sharin was discharged home later this morning on Tylenol and  Vicodin p.r.n. pain, Keflex 500 mg b.i.d. for 1 week and will follow up in  my office tomorrow to have the nasal packs removed and will follow up on  Friday to have the Mead sinus pack removed.      CEN/MEDQ  D:  11/30/2004  T:  11/30/2004  Job:  [id number]   cc:   Aida Puffer  [address]  [place]  [place] [address]  Fax: [phone]

## 2011-03-18 NOTE — Assessment & Plan Note (Signed)
[facility]                           GASTROENTEROLOGY OFFICE NOTE   TOYNA, [name]                         MRN:          [id number]  DATE:09/06/2006                            DOB:          1966/01/25    REASON FOR CONSULTATION:  Abdominal pain, diarrhea, blood in stools, nausea  and vomiting.   ASSESSMENT:  A 45 year old woman with recurrent episodes of abdominal pain  mainly in the right lower quadrant, diarrhea, nausea and vomiting.  She has  had Hemoccult-positive stools detected on several occasions.  CT scanning of  the abdomen and pelvis in the emergency room in July was unrevealing.  The  possibility of Crohn's disease certainly exists.  She had CBC, CMET, and  stool studies with culture, ova and parasite screen, C. diff toxins  recently, which she tells me were all negative.  We will request these.  The  possibility of an intermittent bowel obstruction related to adhesions exists  as well, though that was not evident on her recent CT scan.   PLAN:  Schedule colonoscopy to look for the possibility of Crohn's  ileocolitis, which could be causing this.  Further plans pending that  result.  Risks, benefits, and indications are explained.  She understands  and agrees to proceed.   HISTORY:  A 45 year old woman for the past 6 months has had at least 3  spells of these problems as described above.  I do not think she has had  fever, though she has felt hot at times.  She has had some night sweats.  She has actually gained weight.  She was in the emergency department with a  severe episode in July.  Dr. Zachery Dakins of surgery saw her and thought that  she probably had a gastroenteritis.  She had a mildly distended gallbladder  on that CT scan.  There were no stones.  In the past, she has had  clostridium difficile infection, seen, evaluated, and treated by Dr. Kinnie Scales  in 2006, she took vancomycin, which resolved it.  She had had sequential  antibiotics, including Cipro, Z-Pak, Levaquin, and Augmentin for recurrent  sinus problems.  At the time of that diagnosis, she did not have a C. diff  toxin that was positive that I am aware of, or at least not from the initial  evaluation.  That was a clinical diagnosis made by Dr. Kinnie Scales.   There is no melena or rectal bleeding reported.  There is occasional  heartburn.  Note, the symptoms come on forcefully and suddenly without much  warning.   MEDICATIONS:  1. Xanax 0.5 mg Abigail.i.d.  2. Prozac 40 mg daily.  3. Wellbutrin 300 mg daily.  4. Yaz daily.   DRUG ALLERGIES:  SULFA causes abdominal pain, nausea and vomiting.   PAST MEDICAL HISTORY:  Anxiety disorder, previous back surgery, allergies,  depression, prior tubal ligation.   FAMILY HISTORY:  Noncontributory.  No colon cancer, inflammatory bowel  disease.   SOCIAL HISTORY:  She is married.  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  She uses alcohol, but has  not been drinking lately because it  bothers her.   REVIEW OF SYSTEMS:  See medical history form.  There is some chronic back  pain, fatigue, insomnia.   PHYSICAL EXAM:  An overweight white woman.  Height 5 feet 1 inch, weight 173 pounds, blood pressure 124/76, pulse 82.  EYES:  Anicteric.  ENT:  Normal mouth and posterior pharynx.  NECK:  Supple.  No thyromegaly or mass.  CHEST:  Clear.  HEART:  S1, S2.  No murmur, rub, or gallop.  ABDOMEN:  Mildly tender right lower quadrant without organomegaly or mass.  RECTAL:  Exam is deferred.  LYMPHATICS:  No neck, supraclavicular, or groin adenopathy.  EXTREMITIES:  No edema.  SKIN:  No rash.  NEURO/PSYCH:  She is alert and oriented x3.     Abigail Boop, MD,FACG  Electronically Signed    CEG/MedQ  DD: 09/06/2006  DT: 09/06/2006  Job #: [id number]   cc:   Aida Puffer

## 2011-08-12 LAB — URINALYSIS, ROUTINE W REFLEX MICROSCOPIC
Bilirubin Urine: NEGATIVE
Glucose, UA: NEGATIVE
Hgb urine dipstick: NEGATIVE
Ketones, ur: 15 — AB
Protein, ur: NEGATIVE

## 2011-08-12 LAB — COMPREHENSIVE METABOLIC PANEL
AST: 15
Albumin: 3.9
Alkaline Phosphatase: 56
BUN: 11
CO2: 23
Chloride: 106
Creatinine, Ser: 0.85
GFR calc Af Amer: >60
GFR calc non Af Amer: >60
Potassium: 3.6
Total Bilirubin: 0.6

## 2011-08-12 LAB — RAPID URINE DRUG SCREEN, HOSP PERFORMED
Amphetamines: NOT DETECTED
Benzodiazepines: POSITIVE — AB
Cocaine: POSITIVE — AB
Tetrahydrocannabinol: NOT DETECTED

## 2011-08-12 LAB — CBC
MCHC: 34.5
MCV: 89
RBC: 4.74
RDW: 12.8

## 2011-08-12 LAB — ETHANOL: Alcohol, Ethyl (B): <5

## 2011-08-12 LAB — PROTIME-INR: INR: 0.9

## 2011-08-12 LAB — DIFFERENTIAL
Basophils Absolute: 0.1
Basophils Relative: 0
Lymphocytes Relative: 24
Monocytes Absolute: 0.6
Neutro Abs: 13 — ABNORMAL HIGH

## 2011-08-12 LAB — APTT: aPTT: 25

## 2011-08-15 LAB — CBC
Hemoglobin: 11.7 — ABNORMAL LOW
MCHC: 34.6
MCHC: 34.6
MCV: 88
Platelets: 347
RBC: 4.25
RDW: 12.6
WBC: 12.7 — ABNORMAL HIGH

## 2011-08-15 LAB — BASIC METABOLIC PANEL
BUN: 5 — ABNORMAL LOW
Calcium: 8.8
Creatinine, Ser: 0.67
GFR calc Af Amer: >60

## 2011-08-15 LAB — PROTIME-INR
INR: 1
Prothrombin Time: 13.1

## 2011-08-15 LAB — APTT: aPTT: 24

## 2011-08-15 LAB — DIFFERENTIAL
Basophils Relative: 0
Eosinophils Absolute: 0.3
Lymphs Abs: 2.3
Monocytes Relative: 5
Neutro Abs: 9.4 — ABNORMAL HIGH
Neutrophils Relative %: 75

## 2013-07-15 ENCOUNTER — Ambulatory Visit (HOSPITAL_COMMUNITY)
Admission: RE | Admit: 2013-07-15 | Discharge: 2013-07-15 | Disposition: A | Discharge status: Home / Self Care | Payer: No Typology Code available for payment source | Source: Ambulatory Visit | Attending: Nurse Practitioner | Admitting: Nurse Practitioner

## 2013-07-15 ENCOUNTER — Other Ambulatory Visit (HOSPITAL_COMMUNITY): Payer: Self-pay | Admitting: Nurse Practitioner

## 2013-07-15 DIAGNOSIS — R52 Pain, unspecified: Secondary | ICD-10-CM

## 2013-07-15 DIAGNOSIS — M79609 Pain in unspecified limb: Secondary | ICD-10-CM | POA: Insufficient documentation

## 2013-07-15 DIAGNOSIS — R209 Unspecified disturbances of skin sensation: Secondary | ICD-10-CM | POA: Insufficient documentation

## 2013-07-15 DIAGNOSIS — M19049 Primary osteoarthritis, unspecified hand: Secondary | ICD-10-CM | POA: Insufficient documentation

## 2014-02-06 ENCOUNTER — Encounter: Payer: Self-pay | Admitting: Internal Medicine

## 2014-07-18 ENCOUNTER — Encounter: Payer: Self-pay | Admitting: Internal Medicine

## 2018-09-25 ENCOUNTER — Other Ambulatory Visit: Payer: Self-pay | Admitting: Rheumatology

## 2018-09-25 DIAGNOSIS — M533 Sacrococcygeal disorders, not elsewhere classified: Secondary | ICD-10-CM

## 2018-09-25 DIAGNOSIS — M549 Dorsalgia, unspecified: Secondary | ICD-10-CM

## 2018-09-25 DIAGNOSIS — M064 Inflammatory polyarthropathy: Secondary | ICD-10-CM

## 2018-10-07 ENCOUNTER — Ambulatory Visit
Admission: RE | Admit: 2018-10-07 | Discharge: 2018-10-07 | Disposition: A | Discharge status: Home / Self Care | Payer: Self-pay | Source: Ambulatory Visit | Attending: Rheumatology | Admitting: Rheumatology

## 2018-10-07 DIAGNOSIS — M064 Inflammatory polyarthropathy: Secondary | ICD-10-CM

## 2018-10-07 DIAGNOSIS — M549 Dorsalgia, unspecified: Secondary | ICD-10-CM

## 2018-10-07 DIAGNOSIS — M533 Sacrococcygeal disorders, not elsewhere classified: Secondary | ICD-10-CM

## 2018-10-07 MED ORDER — GADOBENATE DIMEGLUMINE 529 MG/ML IV SOLN
20.0000 mL | Freq: Once | INTRAVENOUS | Status: AC | PRN
  Administered 2018-10-07: 20 mL via INTRAVENOUS

## 2018-11-28 ENCOUNTER — Ambulatory Visit: Payer: BLUE CROSS/BLUE SHIELD | Admitting: Neurology

## 2019-02-27 IMAGING — MR MR SACRUM / SI JOINTS WO/W CM
4 of 8 series · 20 of 48 positions shown · IV contrast (20 ml multihance)
Comparison: Plain films of the SI joints 07/04/2018.

CLINICAL DATA: Left worse than right low back pain for 1 year. No
known injury.

Creatinine was obtained on site at [HOSPITAL] at [HOSPITAL].
Results: Creatinine 1.0 mg/dL.
EXAM:
MRI SACRUM WITHOUT AND WITH CONTRAST
TECHNIQUE: Multiplanar, multisequence MR imaging of the sacrum was performed
both before and after administration of intravenous contrast.
CONTRAST:  20 mL MULTIHANCE GADOBENATE DIMEGLUMINE 529 MG/ML IV SOLN

[Series 4: T2 fat-sat · axial · 4.0mm · 0.53mm/px · z∈[-124,+33]mm · 7 of 34 slices shown]
[im 1/34]
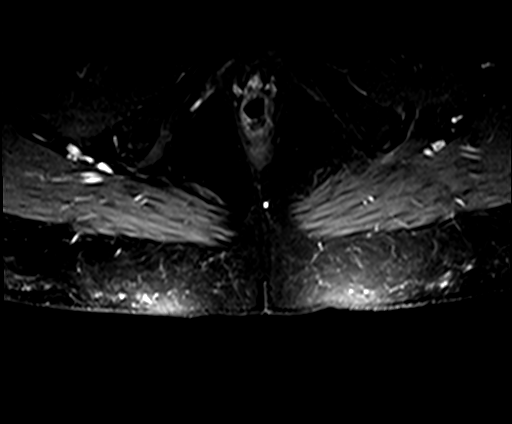
[im 6/34]
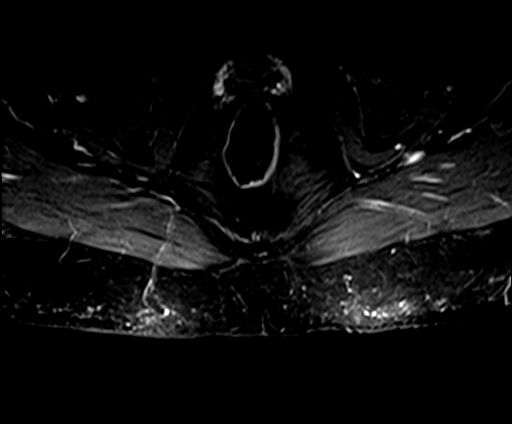
[im 12/34]
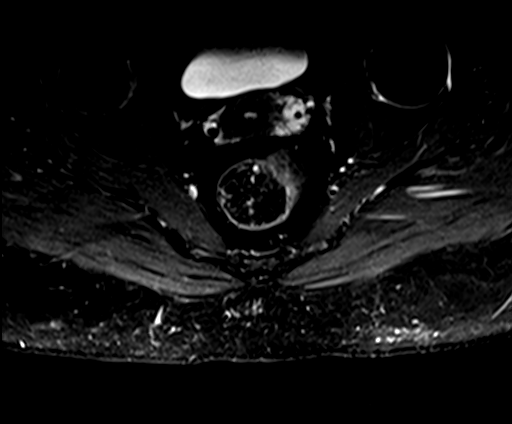
[im 17/34]
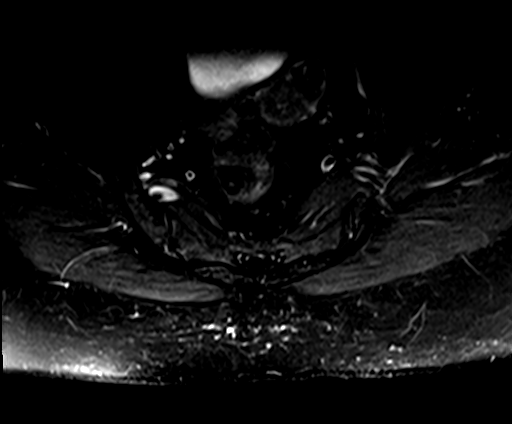
[im 23/34]
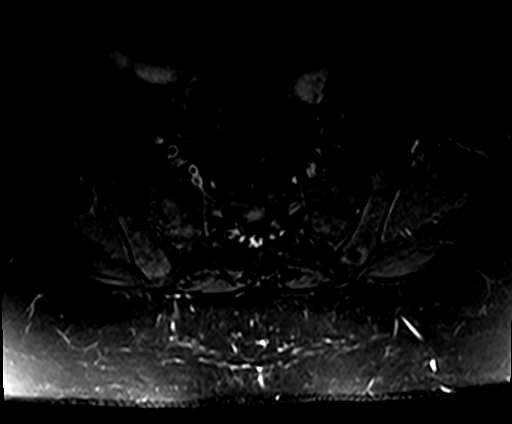
[im 28/34]
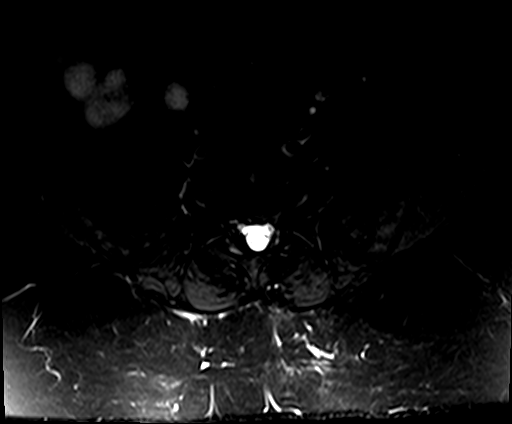
[im 34/34]
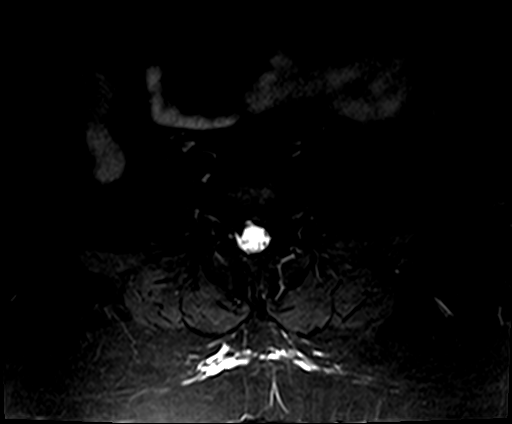

[Series 5: T1 · axial · 5.0mm · 0.53mm/px · z∈[-121,+33]mm · 6 of 25 slices shown (1 of 2)]
[im 1/25]
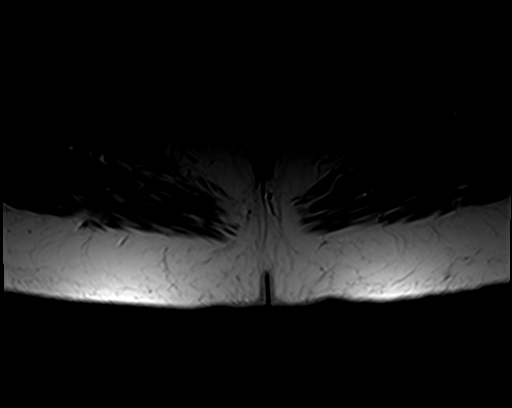
[im 5/25]
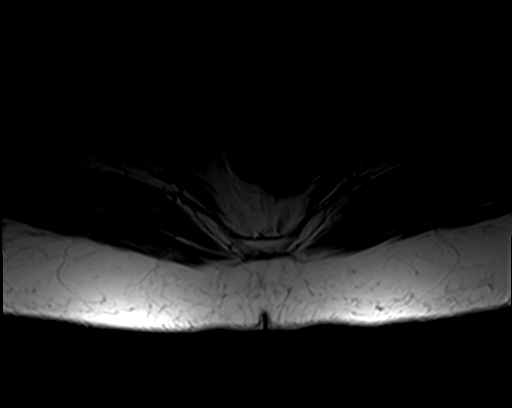
[im 10/25]
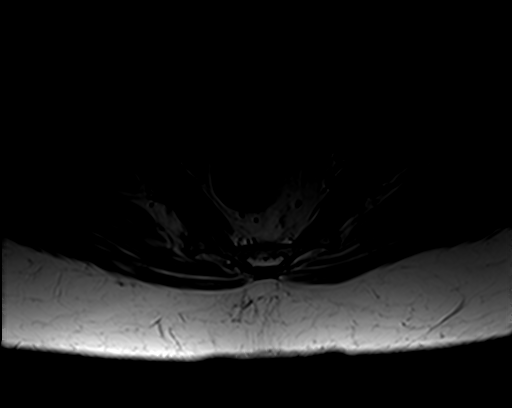
[im 15/25]
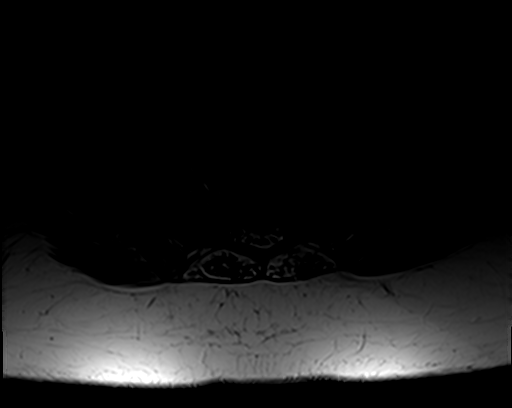
[im 20/25]
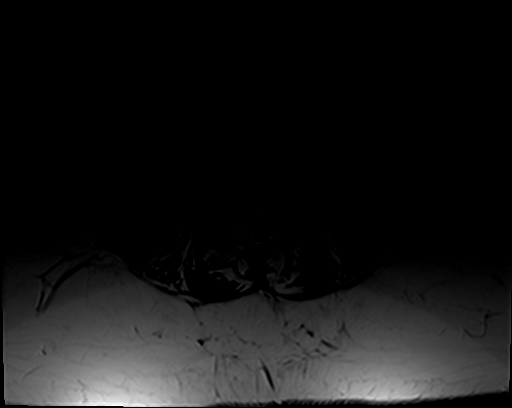
[im 25/25]
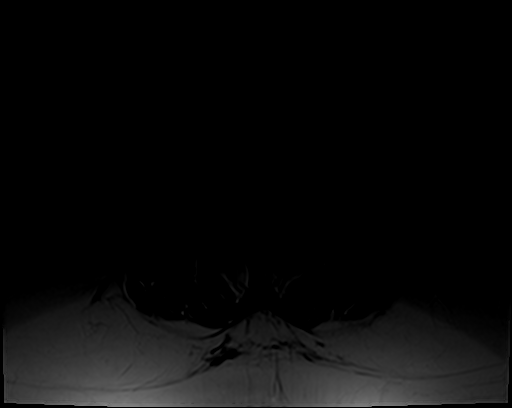

[Series 6: T1 · coronal · 4.0mm · 0.42mm/px · 4 of 20 slices shown (2 of 2)]
[im 1/20]
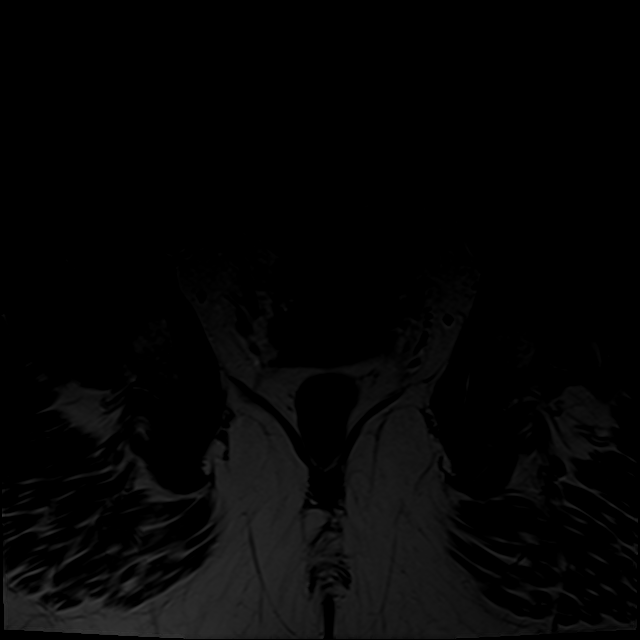
[im 5/20]
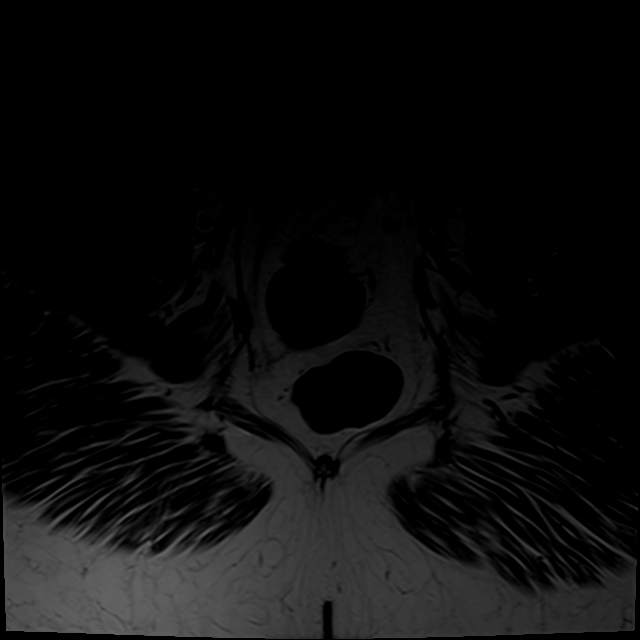
[im 10/20]
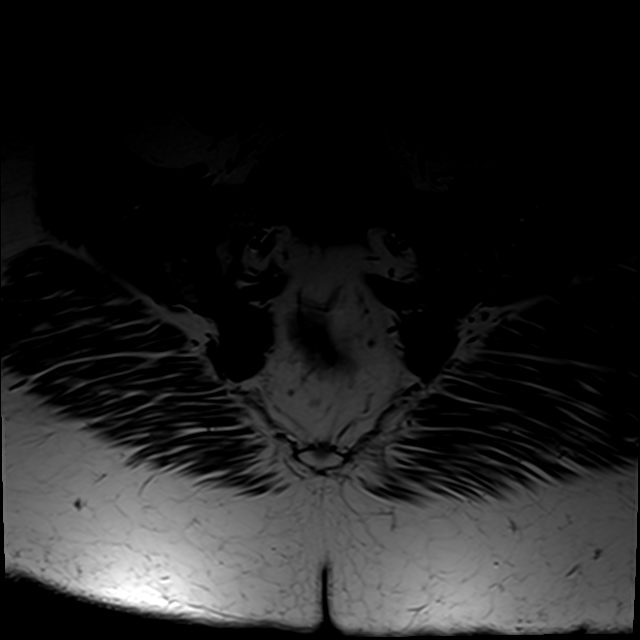
[im 20/20]
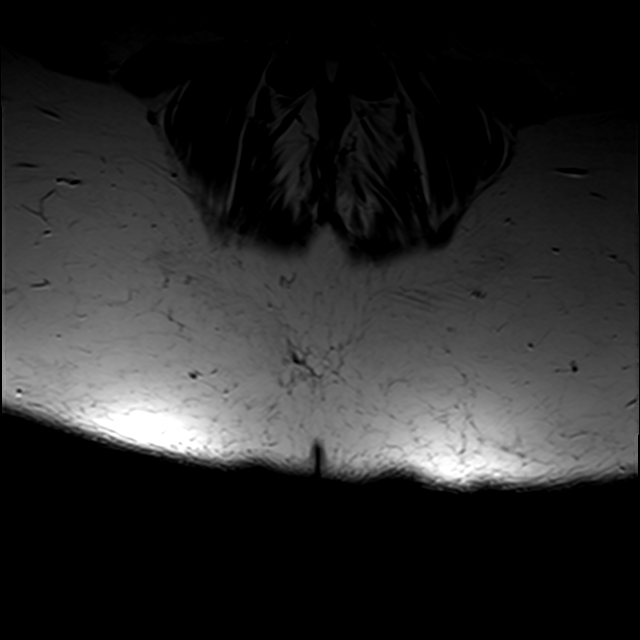

[Series 7: STIR · coronal · 4.0mm · 0.51mm/px · 3 of 20 slices shown]
[im 1/20]
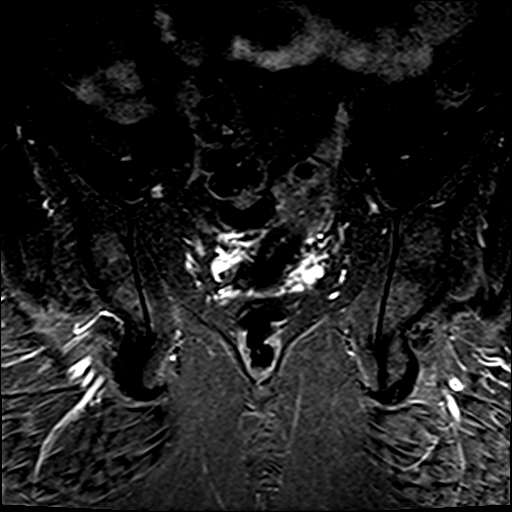
[im 10/20]
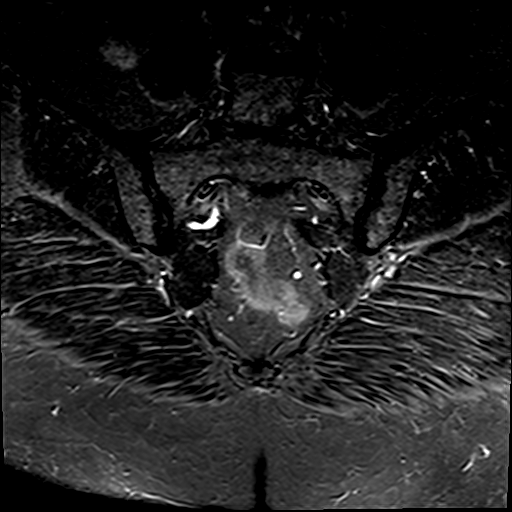
[im 20/20]
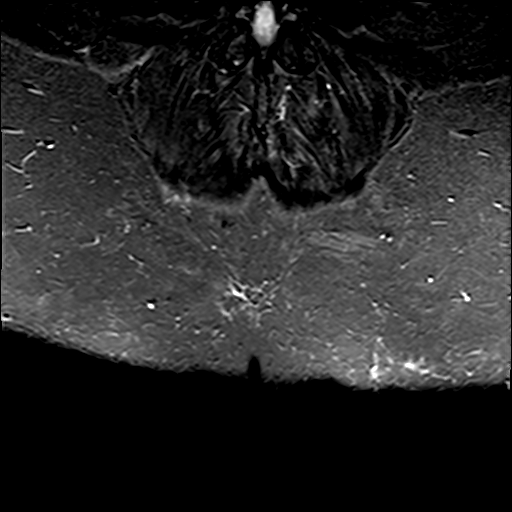

[20 of 48 positions shown; findings below may reference images not displayed]

FINDINGS: Bones/Joint/Cartilage

Mild degenerative change is present about the SI joints. The joints
are not ankylosed or widened. No erosion or edema about the joints
is identified. No pathologic enhancement is present. There is no
fracture, stress change or focal lesion.

Ligaments

Intact.

Muscles and Tendons

Normal.

Soft tissues

Imaged intrapelvic contents demonstrate no acute or focal
abnormality.
IMPRESSION: Mild SI joint degenerative change. There is no evidence of
sacroiliitis. The examination is otherwise unremarkable.

## 2019-08-17 ENCOUNTER — Other Ambulatory Visit: Payer: Self-pay

## 2019-08-17 DIAGNOSIS — Z20822 Contact with and (suspected) exposure to covid-19: Secondary | ICD-10-CM

## 2019-08-18 LAB — NOVEL CORONAVIRUS, NAA: SARS-CoV-2, NAA: NOT DETECTED

## 2022-01-15 ENCOUNTER — Ambulatory Visit
Admission: EM | Admit: 2022-01-15 | Discharge: 2022-01-15 | Disposition: A | Discharge status: Home / Self Care | Payer: Medicare Other | Attending: Physician Assistant | Admitting: Physician Assistant

## 2022-01-15 ENCOUNTER — Other Ambulatory Visit: Payer: Self-pay

## 2022-01-15 ENCOUNTER — Encounter: Payer: Self-pay | Admitting: Physician Assistant

## 2022-01-15 DIAGNOSIS — R110 Nausea: Secondary | ICD-10-CM | POA: Diagnosis present

## 2022-01-15 DIAGNOSIS — R350 Frequency of micturition: Secondary | ICD-10-CM | POA: Insufficient documentation

## 2022-01-15 LAB — POCT URINALYSIS DIP (MANUAL ENTRY)
Bilirubin, UA: NEGATIVE
Blood, UA: NEGATIVE
Glucose, UA: NEGATIVE mg/dL
Ketones, POC UA: NEGATIVE mg/dL
Leukocytes, UA: NEGATIVE
Nitrite, UA: NEGATIVE
Protein Ur, POC: NEGATIVE mg/dL
Spec Grav, UA: 1.015 (ref 1.010–1.025)
Urobilinogen, UA: 0.2 E.U./dL
pH, UA: 7 (ref 5.0–8.0)

## 2022-01-15 MED ORDER — ONDANSETRON 4 MG PO TBDP: 4.0000 mg | ORAL_TABLET | Freq: Three times a day (TID) | ORAL | 0 refills | Status: AC | PRN

## 2022-01-15 NOTE — ED Provider Notes (Signed)
?[facility] ? ? ? ?CSN: [id number] ?Arrival date & time: 01/15/22  6720 ? ? ?  ? ?History   ?Chief Complaint ?Chief Complaint  ?Patient presents with  ? Urinary Frequency  ? ? ?HPI ?[name] is a 56 y.o. female.  ? ?Patient here today for concerns for possible UTI.  She reports that she has had urinary frequency and malodorous urine for the last 5 days.  She notes that she has also had nausea the last few nights.  She has felt febrile at night as well.  She does not report any dysuria.  She does note that she has had recent changes in medication, including addition of ropinirole as well as restarting fluoxetine.  ? ?The history is provided by the patient.  ? ?History reviewed. No pertinent past medical history. ? ?Patient Active Problem List  ? Diagnosis Date Noted  ? 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 GASTRITIS, CHRONIC 01/03/2007  ? ? ?History reviewed. No pertinent surgical history. ? ?OB History   ?No obstetric history on file. ?  ? ? ? ?Home Medications   ? ?Prior to Admission medications   ?Medication Sig Start Date End Date Taking? Authorizing Provider  ?ondansetron (ZOFRAN-ODT) 4 MG disintegrating tablet Take 1 tablet (4 mg total) by mouth every 8 (eight) hours as needed. 01/15/22  Yes Tomi Bamberger, PA-C  ?prazosin (MINIPRESS) 2 MG capsule Take by mouth. 01/10/22  Yes [provider]  ?famotidine (PEPCID) 40 MG tablet  09/22/21   [provider]  ?FLUoxetine (PROZAC) 20 MG capsule Take 60 mg by mouth every morning. 11/05/21   [provider]  ?Oxcarbazepine (TRILEPTAL) 300 MG tablet Take 300 mg by mouth 2 (two) times daily. 12/20/21   [provider]  ?pantoprazole (PROTONIX) 40 MG tablet Take 40 mg by mouth 2 (two) times daily. 12/20/21   [provider]  ?prazosin (MINIPRESS) 2 MG capsule Take 2 mg by mouth at bedtime. 01/12/22   [provider]  ?pregabalin (LYRICA) 75 MG capsule Take 75 mg by mouth 3 (three) times daily.  12/07/21   [provider]  ?QUEtiapine (SEROQUEL XR) 200 MG 24 hr tablet Take 200 mg by mouth at bedtime. 07/22/21   [provider]  ?QUEtiapine (SEROQUEL XR) 400 MG 24 hr tablet Take 400 mg by mouth at bedtime. 01/10/22   [provider]  ? ? ?Family History ?History reviewed. No pertinent family history. ? ?Social History ?  ? ? ?Allergies   ?Sulfamethoxazole ? ? ?Review of Systems ?Review of Systems  ?Constitutional:  Negative for chills and fever.  ?Respiratory:  Negative for shortness of breath.   ?Gastrointestinal:  Positive for nausea. Negative for abdominal pain and vomiting.  ?Genitourinary:  Positive for frequency. Negative for decreased urine volume and dysuria.  ? ? ?Physical Exam ?Triage Vital Signs ?ED Triage Vitals  ?Enc Vitals Group  ?   BP   ?   Pulse   ?   Resp   ?   Temp   ?   Temp src   ?   SpO2   ?   Weight   ?   Height   ?   Head Circumference   ?   Peak Flow   ?   Pain Score   ?   Pain Loc   ?   Pain Edu?   ?   Excl. in GC?   ? ?No data found. ? ?Updated Vital Signs ?BP Marland Kitchen)  146/89 (BP Location: Left Arm)   Pulse 91   Temp 97.6 ?F (36.4 ?C) (Oral)   Resp 20   SpO2 97%  ?  ? ?Physical Exam ?Vitals and nursing note reviewed.  ?Constitutional:   ?   General: She is not in acute distress. ?   Appearance: Normal appearance. She is not ill-appearing.  ?HENT:  ?   Head: Normocephalic and atraumatic.  ?   Nose: Nose normal.  ?Cardiovascular:  ?   Rate and Rhythm: Normal rate and regular rhythm.  ?   Heart sounds: Normal heart sounds. No murmur heard. ?Pulmonary:  ?   Effort: Pulmonary effort is normal. No respiratory distress.  ?   Breath sounds: Normal breath sounds. No wheezing, rhonchi or rales.  ?Skin: ?   General: Skin is warm and dry.  ?Neurological:  ?   Mental Status: She is alert.  ?Psychiatric:     ?   Mood and Affect: Mood normal.     ?   Thought Content: Thought content normal.  ? ? ? ?UC Treatments / Results  ?Labs ?(all labs ordered are listed, but only  abnormal results are displayed) ?Labs Reviewed  ?POCT URINALYSIS DIP (MANUAL ENTRY) - Abnormal; Notable for the following components:  ?    Result Value  ? Color, UA light yellow (*)   ? All other components within normal limits  ?URINE CULTURE  ? ? ?EKG ? ? ?Radiology ?No results found. ? ?Procedures ?Procedures (including critical care time) ? ?Medications Ordered in UC ?Medications - No data to display ? ?Initial Impression / Assessment and Plan / UC Course  ?I have reviewed the triage vital signs and the nursing notes. ? ?Pertinent labs & imaging results that were available during my care of the patient were reviewed by me and considered in my medical decision making (see chart for details). ? ?  ?UA without signs of infection.  Will order urine culture but discussed that symptoms may be related to recent medication changes.  Zofran prescribed for nausea.  Encouraged follow-up with PCP if symptoms continue.  Patient expresses understanding. ? ?Final Clinical Impressions(s) / UC Diagnoses  ? ?Final diagnoses:  ?Urinary frequency  ?Nausea  ? ?Discharge Instructions   ?None ?  ? ?ED Prescriptions   ? ? Medication Sig Dispense Auth. Provider  ? ondansetron (ZOFRAN-ODT) 4 MG disintegrating tablet Take 1 tablet (4 mg total) by mouth every 8 (eight) hours as needed. 20 tablet Tomi Bamberger, PA-C  ? ?  ? ?PDMP not reviewed this encounter. ?  ?Tomi Bamberger, PA-C ?01/15/22 0935 ? ?

## 2022-01-15 NOTE — ED Triage Notes (Signed)
Pt c/o uti symptoms (urinary frequency and urine odor), and nausea.  ?Started: 5 DAYS AGO ?

## 2022-01-16 LAB — URINE CULTURE

## 2022-06-15 ENCOUNTER — Encounter (HOSPITAL_BASED_OUTPATIENT_CLINIC_OR_DEPARTMENT_OTHER): Payer: Self-pay

## 2022-06-15 ENCOUNTER — Other Ambulatory Visit: Payer: Self-pay

## 2022-06-15 ENCOUNTER — Emergency Department (HOSPITAL_BASED_OUTPATIENT_CLINIC_OR_DEPARTMENT_OTHER)
Admission: EM | Admit: 2022-06-15 | Discharge: 2022-06-15 | Disposition: A | Discharge status: Home / Self Care | Payer: Medicare Other | Attending: Emergency Medicine | Admitting: Emergency Medicine

## 2022-06-15 ENCOUNTER — Emergency Department (HOSPITAL_BASED_OUTPATIENT_CLINIC_OR_DEPARTMENT_OTHER): Payer: Medicare Other | Admitting: Radiology

## 2022-06-15 ENCOUNTER — Emergency Department (HOSPITAL_BASED_OUTPATIENT_CLINIC_OR_DEPARTMENT_OTHER): Payer: Medicare Other

## 2022-06-15 ENCOUNTER — Ambulatory Visit
Admission: EM | Admit: 2022-06-15 | Discharge: 2022-06-15 | Disposition: A | Discharge status: Home / Self Care | Payer: Medicare Other

## 2022-06-15 DIAGNOSIS — K209 Esophagitis, unspecified without bleeding: Secondary | ICD-10-CM | POA: Insufficient documentation

## 2022-06-15 DIAGNOSIS — R1011 Right upper quadrant pain: Secondary | ICD-10-CM

## 2022-06-15 DIAGNOSIS — Z79899 Other long term (current) drug therapy: Secondary | ICD-10-CM | POA: Diagnosis not present

## 2022-06-15 DIAGNOSIS — I10 Essential (primary) hypertension: Secondary | ICD-10-CM | POA: Diagnosis not present

## 2022-06-15 DIAGNOSIS — E119 Type 2 diabetes mellitus without complications: Secondary | ICD-10-CM | POA: Diagnosis not present

## 2022-06-15 HISTORY — DX: Bipolar disorder, unspecified: F31.9

## 2022-06-15 HISTORY — DX: Essential (primary) hypertension: I10

## 2022-06-15 HISTORY — DX: Type 2 diabetes mellitus without complications: E11.9

## 2022-06-15 LAB — COMPREHENSIVE METABOLIC PANEL
ALT: 16 U/L (ref 0–44)
AST: 13 U/L — ABNORMAL LOW (ref 15–41)
Albumin: 4.7 g/dL (ref 3.5–5.0)
Alkaline Phosphatase: 71 U/L (ref 38–126)
Anion gap: 10 (ref 5–15)
BUN: 15 mg/dL (ref 6–20)
CO2: 26 mmol/L (ref 22–32)
Calcium: 9.6 mg/dL (ref 8.9–10.3)
Chloride: 101 mmol/L (ref 98–111)
Creatinine, Ser: 0.67 mg/dL (ref 0.44–1.00)
GFR, Estimated: >60 mL/min (ref >60)
Glucose, Bld: 123 mg/dL — ABNORMAL HIGH (ref 70–99)
Potassium: 4.1 mmol/L (ref 3.5–5.1)
Sodium: 137 mmol/L (ref 135–145)
Total Bilirubin: 0.4 mg/dL (ref 0.3–1.2)
Total Protein: 7.8 g/dL (ref 6.5–8.1)

## 2022-06-15 LAB — URINALYSIS, ROUTINE W REFLEX MICROSCOPIC
Bilirubin Urine: NEGATIVE
Glucose, UA: NEGATIVE mg/dL
Hgb urine dipstick: NEGATIVE
Ketones, ur: NEGATIVE mg/dL
Nitrite: NEGATIVE
Protein, ur: NEGATIVE mg/dL
Specific Gravity, Urine: 1.009 (ref 1.005–1.030)
pH: 5.5 (ref 5.0–8.0)

## 2022-06-15 LAB — CBC
HCT: 40.8 % (ref 36.0–46.0)
Hemoglobin: 14.2 g/dL (ref 12.0–15.0)
MCH: 30.1 pg (ref 26.0–34.0)
MCHC: 34.8 g/dL (ref 30.0–36.0)
MCV: 86.4 fL (ref 80.0–100.0)
Platelets: 293 10*3/uL (ref 150–400)
RBC: 4.72 MIL/uL (ref 3.87–5.11)
RDW: 12.4 % (ref 11.5–15.5)
WBC: 10.6 10*3/uL — ABNORMAL HIGH (ref 4.0–10.5)
nRBC: 0 % (ref 0.0–0.2)

## 2022-06-15 LAB — TROPONIN I (HIGH SENSITIVITY)
Troponin I (High Sensitivity): <2 ng/L (ref <18)
Troponin I (High Sensitivity): 2 ng/L (ref <18)

## 2022-06-15 LAB — LIPASE, BLOOD: Lipase: 25 U/L (ref 11–51)

## 2022-06-15 MED ORDER — MORPHINE SULFATE (PF) 4 MG/ML IV SOLN
4.0000 mg | Freq: Once | INTRAVENOUS | Status: AC
  Administered 2022-06-15: 4 mg via INTRAVENOUS
  Filled 2022-06-15 (×2): qty 1

## 2022-06-15 MED ORDER — ALUM & MAG HYDROXIDE-SIMETH 200-200-20 MG/5ML PO SUSP
30.0000 mL | Freq: Once | ORAL | Status: AC
  Administered 2022-06-15: 30 mL via ORAL
  Filled 2022-06-15: qty 30

## 2022-06-15 MED ORDER — SUCRALFATE 1 G PO TABS: 1.0000 g | ORAL_TABLET | Freq: Three times a day (TID) | ORAL | 0 refills | Status: AC

## 2022-06-15 MED ORDER — IOHEXOL 300 MG/ML  SOLN
100.0000 mL | Freq: Once | INTRAMUSCULAR | Status: AC | PRN
  Administered 2022-06-15: 100 mL via INTRAVENOUS

## 2022-06-15 MED ORDER — SODIUM CHLORIDE 0.9 % IV BOLUS
1000.0000 mL | Freq: Once | INTRAVENOUS | Status: AC
  Administered 2022-06-15: 1000 mL via INTRAVENOUS

## 2022-06-15 MED ORDER — ONDANSETRON HCL 4 MG/2ML IJ SOLN
4.0000 mg | Freq: Once | INTRAMUSCULAR | Status: AC
  Administered 2022-06-15: 4 mg via INTRAVENOUS
  Filled 2022-06-15 (×2): qty 2

## 2022-06-15 NOTE — ED Notes (Signed)
She chooses to decline the IV and tells me she is "fine right now-I don't need pain medicine".

## 2022-06-15 NOTE — ED Triage Notes (Signed)
Patient here POV from Home.  Endorses RUQ ABD Pain/Lower Right CP that began Saturday. Worsened Since and Radiates to Back as well. Intermittent and [employer] in [employer].   No Dysuria. Some Nausea. No Emesis or Diarrhea. No SOB.   NAD Noted during Triage. A&Ox4. GCS 15. Ambulatory.

## 2022-06-15 NOTE — Discharge Instructions (Addendum)
You likely have esophagitis.  Please continue taking your Protonix and Pepcid.  Please avoid spicy food  Please call GI office for follow-up and you likely will need an endoscopy.  I have added Carafate 4 times daily to help you with gastritis symptoms  Return to ER if you have worse abdominal pain or vomiting or chest pain

## 2022-06-15 NOTE — ED Triage Notes (Signed)
Pt reports RUQ pain worsening with eating.  No vomiting or diarrhea but does have nausea when pain gets worse.  Pt uncomfortable during intake.  Provider notified.

## 2022-06-15 NOTE — ED Provider Notes (Signed)
[facility]    CSN: [id number] Arrival date & time: 06/15/22  1107      History   Chief Complaint Chief Complaint  Patient presents with   Abdominal Pain    HPI [name] is a 56 y.o. female.   Patient presents with right upper quadrant abdominal pain that radiates around to her back that has been present for about 4 to 5 days.  Patient denies any obvious injury to the area.  Patient reports some nausea without vomiting.  Denies diarrhea or blood in stool.  Patient is still having normal bowel movements.  Patient reports the pain becomes significant at times and worsens when she is eating.  Currently her pain is 6/10 on pain scale and is described as a sharp shooting pain.  Denies history of gastrointestinal problems.  Denies any associated fever.  Called her PCP who told her to take some Gas-X medication which has not provided her any relief.   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 No pertinent surgical history.  OB History   No obstetric history on file.      Home Medications    Prior to Admission medications   Medication Sig Start Date End Date Taking? Authorizing Provider  famotidine (PEPCID) 40 MG tablet  09/22/21   [provider]  FLUoxetine (PROZAC) 20 MG capsule Take 60 mg by mouth every morning. 11/05/21   [provider]  ondansetron (ZOFRAN-ODT) 4 MG disintegrating tablet Take 1 tablet (4 mg total) by mouth every 8 (eight) hours as needed. 01/15/22   Tomi Bamberger, PA-C  Oxcarbazepine (TRILEPTAL) 300 MG tablet Take 300 mg by mouth 2 (two) times daily. 12/20/21   [provider]  pantoprazole (PROTONIX) 40 MG tablet Take 40 mg by mouth 2 (two) times  daily. 12/20/21   [provider]  prazosin (MINIPRESS) 2 MG capsule Take 2 mg by mouth at bedtime. 01/12/22   [provider]  prazosin (MINIPRESS) 2 MG capsule Take by mouth. 01/10/22   [provider]  pregabalin (LYRICA) 75 MG capsule Take 75 mg by mouth 3 (three) times daily. 12/07/21   [provider]  QUEtiapine (SEROQUEL XR) 200 MG 24 hr tablet Take 200 mg by mouth at bedtime. 07/22/21   [provider]  QUEtiapine (SEROQUEL XR) 400 MG 24 hr tablet Take 400 mg by mouth at bedtime. 01/10/22   [provider]    Family History History reviewed. No pertinent family history.  Social History Social History   Tobacco Use   Smoking status: Never   Smokeless tobacco: Never  Vaping Use   Vaping Use: Never used  Substance Use Topics   Alcohol use: Not Currently   Drug use: Never     Allergies   Sulfamethoxazole   Review of Systems Review of Systems Per HPI  Physical Exam Triage Vital Signs ED Triage Vitals  Enc Vitals Group     BP 06/15/22 1228 (!) 156/91     Pulse Rate 06/15/22 1228 95     Resp 06/15/22 1228 (!) 22     Temp 06/15/22 1228 97.8 F (36.6 C)     Temp Source 06/15/22 1228 Oral  SpO2 06/15/22 1228 96 %     Weight --      Height --      Head Circumference --      Peak Flow --      Pain Score 06/15/22 1223 4     Pain Loc --      Pain Edu? --      Excl. in GC? --    No data found.  Updated Vital Signs BP (!) 156/91 (BP Location: Left Arm)   Pulse 95   Temp 97.8 F (36.6 C) (Oral)   Resp (!) 22   LMP  (Exact Date)   SpO2 96%   Visual Acuity Right Eye Distance:   Left Eye Distance:   Bilateral Distance:    Right Eye Near:   Left Eye Near:    Bilateral Near:     Physical Exam Constitutional:      General: She is not in acute distress.    Appearance: Normal appearance. She is not toxic-appearing or diaphoretic.  HENT:     Head: Normocephalic and atraumatic.  Eyes:     Extraocular  Movements: Extraocular movements intact.     Conjunctiva/sclera: Conjunctivae normal.  Cardiovascular:     Rate and Rhythm: Normal rate and regular rhythm.     Pulses: Normal pulses.     Heart sounds: Normal heart sounds.  Pulmonary:     Effort: Pulmonary effort is normal. No respiratory distress.     Breath sounds: Normal breath sounds. No stridor. No wheezing, rhonchi or rales.     Comments: Patient appears to have pain with inspiration on physical exam. Abdominal:     General: Bowel sounds are normal. There is no distension.     Palpations: Abdomen is soft.     Tenderness: There is abdominal tenderness in the right upper quadrant.  Neurological:     General: No focal deficit present.     Mental Status: She is alert and oriented to person, place, and time. Mental status is at baseline.  Psychiatric:        Mood and Affect: Mood normal.        Behavior: Behavior normal.        Thought Content: Thought content normal.        Judgment: Judgment normal.      UC Treatments / Results  Labs (all labs ordered are listed, but only abnormal results are displayed) Labs Reviewed - No data to display  EKG   Radiology No results found.  Procedures Procedures (including critical care time)  Medications Ordered in UC Medications - No data to display  Initial Impression / Assessment and Plan / UC Course  I have reviewed the triage vital signs and the nursing notes.  Pertinent labs & imaging results that were available during my care of the patient were reviewed by me and considered in my medical decision making (see chart for details).     Patient having significant abdominal pain on physical exam which I think warrants more advanced imaging than can provided here at the urgent care.  Patient was advised to go to the emergency department for further evaluation and management.  Patient was agreeable with plan.  Vital signs stable at discharge.  Agree with patient self transport to  the hospital. Final Clinical Impressions(s) / UC Diagnoses   Final diagnoses:  Abdominal pain, right upper quadrant     Discharge Instructions      Please go to the emergency department as soon as  you leave urgent care for further evaluation and management.    ED Prescriptions   None    PDMP not reviewed this encounter.   Gustavus Bryant, [place] 06/15/22 1255

## 2022-06-15 NOTE — Discharge Instructions (Signed)
Please go to the emergency department as soon as you leave urgent care for further evaluation and management. ?

## 2022-06-15 NOTE — ED Provider Notes (Signed)
[facility] EMERGENCY DEPT Provider Note   CSN: [id number] Arrival date & time: 06/15/22  1315     History  Chief Complaint  Patient presents with   Chest Pain    [name] is a 56 y.o. female history of diabetes, hypertension, reflux here presenting with abdominal pain.  Patient has been having intermittent right upper quadrant pain at right after she eats for several weeks.  She states that she was outside today and doubled over in pain.  She went to her primary care doctor who was concerned for possible biliary colic.  She then was sent to urgent care but they did not have ultrasound or CT so sent her here for further evaluation.  Had some chest pain but states that it radiate from the abdomen.   The history is provided by the patient.       Home Medications Prior to Admission medications   Medication Sig Start Date End Date Taking? Authorizing Provider  famotidine (PEPCID) 40 MG tablet  09/22/21   [provider]  FLUoxetine (PROZAC) 20 MG capsule Take 60 mg by mouth every morning. 11/05/21   [provider]  ondansetron (ZOFRAN-ODT) 4 MG disintegrating tablet Take 1 tablet (4 mg total) by mouth every 8 (eight) hours as needed. 01/15/22   Tomi Bamberger, PA-C  Oxcarbazepine (TRILEPTAL) 300 MG tablet Take 300 mg by mouth 2 (two) times daily. 12/20/21   [provider]  pantoprazole (PROTONIX) 40 MG tablet Take 40 mg by mouth 2 (two) times daily. 12/20/21   [provider]  prazosin (MINIPRESS) 2 MG capsule Take 2 mg by mouth at bedtime. 01/12/22   [provider]  prazosin (MINIPRESS) 2 MG capsule Take by mouth. 01/10/22   [provider]  pregabalin (LYRICA) 75 MG capsule Take 75 mg by mouth 3 (three) times daily. 12/07/21   [provider]  QUEtiapine (SEROQUEL XR) 200 MG 24 hr tablet Take 200 mg by mouth at bedtime. 07/22/21   [provider]  QUEtiapine (SEROQUEL XR) 400 MG 24 hr tablet Take 400  mg by mouth at bedtime. 01/10/22   [provider]      Allergies    Sulfamethoxazole    Review of Systems   Review of Systems  Gastrointestinal:  Positive for abdominal pain.  All other systems reviewed and are negative.   Physical Exam Updated Vital Signs BP (!) 151/98   Pulse 76   Temp 97.9 F (36.6 C)   Resp (!) 22   Ht 5\' 1"  (1.549 m)   Wt 95.3 kg   LMP 07/08/2013   SpO2 99%   BMI 39.70 kg/m  Physical Exam Vitals and nursing note reviewed.  Constitutional:      Comments: Uncomfortable   HENT:     Head: Normocephalic.  Eyes:     Extraocular Movements: Extraocular movements intact.     Pupils: Pupils are equal, round, and reactive to light.  Cardiovascular:     Rate and Rhythm: Normal rate and regular rhythm.     Heart sounds: Normal heart sounds.  Pulmonary:     Effort: Pulmonary effort is normal.     Breath sounds: Normal breath sounds.  Abdominal:     Comments: + RUQ tenderness, ? Murphy sign   Musculoskeletal:        General: Normal range of motion.     Cervical back: Normal range of motion and neck supple.  Skin:    General: Skin is warm.  Capillary Refill: Capillary refill takes less than 2 seconds.  Neurological:     General: No focal deficit present.     Mental Status: She is oriented to person, place, and time.  Psychiatric:        Mood and Affect: Mood normal.        Behavior: Behavior normal.     ED Results / Procedures / Treatments   Labs (all labs ordered are listed, but only abnormal results are displayed) Labs Reviewed  COMPREHENSIVE METABOLIC PANEL - Abnormal; Notable for the following components:      Result Value   Glucose, Bld 123 (*)    AST 13 (*)    All other components within normal limits  CBC - Abnormal; Notable for the following components:   WBC 10.6 (*)    All other components within normal limits  URINALYSIS, ROUTINE W REFLEX MICROSCOPIC - Abnormal; Notable for the following components:   Color, Urine  COLORLESS (*)    Leukocytes,Ua MODERATE (*)    Non Squamous Epithelial 0-5 (*)    All other components within normal limits  LIPASE, BLOOD  TROPONIN I (HIGH SENSITIVITY)  TROPONIN I (HIGH SENSITIVITY)    EKG None  Radiology US Abdomen Limited RUQ (LIVER/GB)  Result Date: 06/15/2022 CLINICAL DATA:  Right arm pain for 4-5 days EXAM: ULTRASOUND ABDOMEN LIMITED RIGHT UPPER QUADRANT COMPARISON:  CT abdomen/pelvis 01/16/2022 FINDINGS: Gallbladder: No gallstones or wall thickening visualized. Sonographic Murphy's sign was reported positive by the sonographer. Common bile duct: Diameter: 2 mm Liver: Parenchymal echogenicity is diffusely increased consistent with fatty infiltration. There are no focal lesions. Portal vein is patent on color Doppler imaging with normal direction of blood flow towards the liver. Other: None. IMPRESSION: 1. Sonographic Murphy's sign was reported positive but without gallstones or other evidence of acute cholecystitis. 2. Fatty infiltration of the liver. Electronically Signed   By: Lesia Hausen M.D.   On: 06/15/2022 17:05   DG Chest 2 View  Result Date: 06/15/2022 CLINICAL DATA:  CP EXAM: CHEST - 2 VIEW COMPARISON:  Chest x-ray 05/24/2006. FINDINGS: The heart size and mediastinal contours are within normal limits. Both lungs are clear. No visible pleural effusions or pneumothorax. No acute osseous abnormality. IMPRESSION: No active cardiopulmonary disease. Electronically Signed   By: Feliberto Harts M.D.   On: 06/15/2022 14:19    Procedures Procedures    Medications Ordered in ED Medications  sodium chloride 0.9 % bolus 1,000 mL (has no administration in time range)  morphine (PF) 4 MG/ML injection 4 mg (4 mg Intravenous Given 06/15/22 1711)  ondansetron (ZOFRAN) injection 4 mg (4 mg Intravenous Given 06/15/22 1712)  iohexol (OMNIPAQUE) 300 MG/ML solution 100 mL (100 mLs Intravenous Contrast Given 06/15/22 1722)    ED Course/ Medical Decision Making/ A&P                            Medical Decision Making [name] is a 57 y.o. female here presenting with chest pain and right upper quadrant pain.  Consider gastritis versus reflux versus biliary colic.  Plan to get CBC and CMP and lipase and right upper quadrant ultrasound.  If right upper quadrant ultrasound did not show any gallbladder pathology patient will need a CT abdomen pelvis.  5:59 PM I reviewed patient's labs and independently interpreted CT scan.  Patient has normal LFTs.  Patient has possible esophagitis on the CT scan and right upper quadrant ultrasound was normal.  I think symptoms likely from reflux.  Patient is already Pepcid and Protonix.  We will add Carafate.  Will refer patient to GI for endoscopy.  Problems Addressed: Esophagitis: acute illness or injury RUQ pain: acute illness or injury  Amount and/or Complexity of Data Reviewed Labs: ordered. Decision-making details documented in ED Course. Radiology: ordered and independent interpretation performed. Decision-making details documented in ED Course.  Risk OTC drugs. Prescription drug management.    Final Clinical Impression(s) / ED Diagnoses Final diagnoses:  None    Rx / DC Orders ED Discharge Orders     None         Charlynne Pander, MD 06/15/22 1806
